# Patient Record
Sex: Female | Born: 1935 | Race: White | Hispanic: No | Smoking: Former smoker
Health system: Southern US, Community
[De-identification: ages and names within clinical notes are randomized; demographics above are authoritative.]

## PROBLEM LIST (undated history)

## (undated) DIAGNOSIS — I10 Essential (primary) hypertension: Secondary | ICD-10-CM

## (undated) DIAGNOSIS — F32A Depression, unspecified: Secondary | ICD-10-CM

## (undated) DIAGNOSIS — R1310 Dysphagia, unspecified: Secondary | ICD-10-CM

## (undated) DIAGNOSIS — F329 Major depressive disorder, single episode, unspecified: Secondary | ICD-10-CM

## (undated) DIAGNOSIS — R062 Wheezing: Secondary | ICD-10-CM

## (undated) DIAGNOSIS — J449 Chronic obstructive pulmonary disease, unspecified: Secondary | ICD-10-CM

## (undated) DIAGNOSIS — G4730 Sleep apnea, unspecified: Secondary | ICD-10-CM

## (undated) DIAGNOSIS — G629 Polyneuropathy, unspecified: Secondary | ICD-10-CM

## (undated) DIAGNOSIS — R059 Cough, unspecified: Secondary | ICD-10-CM

## (undated) DIAGNOSIS — R569 Unspecified convulsions: Secondary | ICD-10-CM

## (undated) DIAGNOSIS — K219 Gastro-esophageal reflux disease without esophagitis: Secondary | ICD-10-CM

## (undated) DIAGNOSIS — H9190 Unspecified hearing loss, unspecified ear: Secondary | ICD-10-CM

## (undated) DIAGNOSIS — J45909 Unspecified asthma, uncomplicated: Secondary | ICD-10-CM

## (undated) DIAGNOSIS — R0902 Hypoxemia: Secondary | ICD-10-CM

## (undated) DIAGNOSIS — R05 Cough: Secondary | ICD-10-CM

## (undated) DIAGNOSIS — G2581 Restless legs syndrome: Secondary | ICD-10-CM

## (undated) DIAGNOSIS — I509 Heart failure, unspecified: Secondary | ICD-10-CM

## (undated) DIAGNOSIS — R609 Edema, unspecified: Secondary | ICD-10-CM

## (undated) DIAGNOSIS — K759 Inflammatory liver disease, unspecified: Secondary | ICD-10-CM

## (undated) HISTORY — PX: EYE SURGERY: SHX253

## (undated) HISTORY — PX: HEMORRHOID SURGERY: SHX153

## (undated) HISTORY — PX: OTHER SURGICAL HISTORY: SHX169

## (undated) HISTORY — PX: HERNIA REPAIR: SHX51

## (undated) HISTORY — PX: ABDOMINAL HYSTERECTOMY: SHX81

## (undated) HISTORY — PX: CHOLECYSTECTOMY: SHX55

---

## 2014-06-24 ENCOUNTER — Other Ambulatory Visit: Payer: Self-pay

## 2014-06-24 ENCOUNTER — Encounter: Payer: Self-pay | Admitting: Emergency Medicine

## 2014-06-24 ENCOUNTER — Emergency Department
Admission: EM | Admit: 2014-06-24 | Discharge: 2014-06-24 | Disposition: A | Discharge status: Home / Self Care | Payer: Medicare Other | Attending: Emergency Medicine | Admitting: Emergency Medicine

## 2014-06-24 ENCOUNTER — Emergency Department: Payer: Medicare Other

## 2014-06-24 DIAGNOSIS — I10 Essential (primary) hypertension: Secondary | ICD-10-CM | POA: Insufficient documentation

## 2014-06-24 DIAGNOSIS — J209 Acute bronchitis, unspecified: Secondary | ICD-10-CM | POA: Diagnosis not present

## 2014-06-24 DIAGNOSIS — Z87891 Personal history of nicotine dependence: Secondary | ICD-10-CM | POA: Insufficient documentation

## 2014-06-24 DIAGNOSIS — R05 Cough: Secondary | ICD-10-CM | POA: Diagnosis present

## 2014-06-24 DIAGNOSIS — J45909 Unspecified asthma, uncomplicated: Secondary | ICD-10-CM | POA: Diagnosis not present

## 2014-06-24 HISTORY — DX: Unspecified asthma, uncomplicated: J45.909

## 2014-06-24 HISTORY — DX: Essential (primary) hypertension: I10

## 2014-06-24 LAB — BASIC METABOLIC PANEL
Anion gap: 7 (ref 5–15)
BUN: 12 mg/dL (ref 6–20)
CO2: 32 mmol/L (ref 22–32)
Calcium: 8.9 mg/dL (ref 8.9–10.3)
Chloride: 96 mmol/L — ABNORMAL LOW (ref 101–111)
Creatinine, Ser: 1.18 mg/dL — ABNORMAL HIGH (ref 0.44–1.00)
GFR calc Af Amer: 49 mL/min — ABNORMAL LOW (ref >60)
GFR calc non Af Amer: 43 mL/min — ABNORMAL LOW (ref >60)
Glucose, Bld: 114 mg/dL — ABNORMAL HIGH (ref 65–99)
POTASSIUM: 4.3 mmol/L (ref 3.5–5.1)
Sodium: 135 mmol/L (ref 135–145)

## 2014-06-24 LAB — TROPONIN I: Troponin I: <0.03 ng/mL (ref <0.031)

## 2014-06-24 LAB — CBC
HCT: 36.6 % (ref 35.0–47.0)
Hemoglobin: 12.3 g/dL (ref 12.0–16.0)
MCH: 30.2 pg (ref 26.0–34.0)
MCHC: 33.5 g/dL (ref 32.0–36.0)
MCV: 90.2 fL (ref 80.0–100.0)
PLATELETS: 338 10*3/uL (ref 150–440)
RBC: 4.06 MIL/uL (ref 3.80–5.20)
RDW: 13.5 % (ref 11.5–14.5)
WBC: 10 10*3/uL (ref 3.6–11.0)

## 2014-06-24 MED ORDER — AZITHROMYCIN 250 MG PO TABS
ORAL_TABLET | ORAL | Status: AC
  Filled 2014-06-24: qty 2

## 2014-06-24 MED ORDER — AZITHROMYCIN 250 MG PO TABS: 250.0000 mg | ORAL_TABLET | Freq: Once | ORAL | Status: DC

## 2014-06-24 MED ORDER — PREDNISONE 20 MG PO TABS
ORAL_TABLET | ORAL | Status: AC
  Filled 2014-06-24: qty 2

## 2014-06-24 MED ORDER — AZITHROMYCIN 250 MG PO TABS
500.0000 mg | ORAL_TABLET | Freq: Once | ORAL | Status: AC
  Administered 2014-06-24: 500 mg via ORAL

## 2014-06-24 MED ORDER — ALBUTEROL SULFATE HFA 108 (90 BASE) MCG/ACT IN AERS: INHALATION_SPRAY | RESPIRATORY_TRACT | Status: AC

## 2014-06-24 MED ORDER — BENZONATATE 100 MG PO CAPS
100.0000 mg | ORAL_CAPSULE | ORAL | Status: AC
  Administered 2014-06-24: 100 mg via ORAL
  Filled 2014-06-24: qty 1

## 2014-06-24 MED ORDER — PREDNISONE 20 MG PO TABS: ORAL_TABLET | ORAL | Status: DC

## 2014-06-24 MED ORDER — IPRATROPIUM-ALBUTEROL 0.5-2.5 (3) MG/3ML IN SOLN
3.0000 mL | Freq: Once | RESPIRATORY_TRACT | Status: AC
  Administered 2014-06-24: 3 mL via RESPIRATORY_TRACT
  Filled 2014-06-24: qty 3

## 2014-06-24 MED ORDER — BENZONATATE 100 MG PO CAPS
ORAL_CAPSULE | ORAL | Status: AC
  Filled 2014-06-24: qty 1

## 2014-06-24 MED ORDER — PREDNISONE 20 MG PO TABS
40.0000 mg | ORAL_TABLET | ORAL | Status: AC
Start: 2014-06-24 — End: 2014-06-24
  Administered 2014-06-24: 40 mg via ORAL

## 2014-06-24 MED ORDER — IPRATROPIUM-ALBUTEROL 0.5-2.5 (3) MG/3ML IN SOLN
RESPIRATORY_TRACT | Status: AC
  Filled 2014-06-24: qty 3

## 2014-06-24 NOTE — ED Notes (Signed)
D/c instructions reviewed w/ pt - pt denies any further questions or concerns at present.   

## 2014-06-24 NOTE — ED Notes (Signed)
Pt in with co cough productive sputum greenish sputum, no fever, no shob.  States has hx of pneumonia in the past.

## 2014-06-24 NOTE — ED Notes (Signed)
[facility] +2 weeks. No increased work of breathing noted at this time.

## 2014-06-24 NOTE — ED Provider Notes (Signed)
[facility] Emergency Department Provider Note  ____________________________________________  Time seen: Approximately 8:53 PM  I have reviewed the triage vital signs and the nursing notes.   HISTORY  Chief Complaint Cough    HPI Monica Warren is a 81 y.o. female with multiple chronic medical problems who presents with about a week of worsening productive cough.  She has a history of asthma and is a former smoker.  She has had pneumonia in the past.  She has multiple other chronic complaints such as intermittent "episodes "in which she has trouble with her vision, but the acute complaint that brings her in today is the cough.  He is in no acute distress now and is frustrated with the cough but is not having trouble breathing.  She has no chest pain and no shortness of breath that is not associated with cough.  She has no abdominal pain, nausea, vomiting, or dysuria.  The cough is severe, particularly at night.   Past Medical History  Diagnosis Date  . Asthma   . Hypertension     There are no active problems to display for this patient.   History reviewed. No pertinent past surgical history.  Current Outpatient Rx  Name  Route  Sig  Dispense  Refill  . albuterol (PROVENTIL HFA;VENTOLIN HFA) 108 (90 BASE) MCG/ACT inhaler      Inhale 4-6 puffs by mouth every 4 hours as needed for wheezing, cough, and/or shortness of breath   1 Inhaler   1   . azithromycin (ZITHROMAX) 250 MG tablet   Oral   Take 1 tablet (250 mg total) by mouth once.   4 each   0   . predniSONE (DELTASONE) 20 MG tablet      Take 2 pills (40 mg) PO on day 1 (tomorrow), 2 pills PO on day 2, 1 pill (20 mg) PO on day 3-5.   7 tablet   0     Allergies Review of patient's allergies indicates no known allergies.  History reviewed. No pertinent family history.  Social History History  Substance Use Topics  . Smoking status: Former Games developer  . Smokeless tobacco: Not on file   . Alcohol Use: No    Review of Systems Constitutional: No fever/chills Eyes: Sometimes has episodes where her vision seems to "go away".  (Chronic and infrequent) ENT: No sore throat. Cardiovascular: Denies chest pain. Respiratory: Denies shortness of breath.  Some wheezing.  Persistent productive cough. Gastrointestinal: No abdominal pain.  No nausea, no vomiting.  No diarrhea.  No constipation. Genitourinary: Negative for dysuria. Musculoskeletal: Negative for back pain. Skin: Negative for rash. Neurological: Negative for headaches, focal weakness or numbness.  10-point ROS otherwise negative.  ____________________________________________   PHYSICAL EXAM:  VITAL SIGNS: ED Triage Vitals  Enc Vitals Group     BP 06/24/14 2005 141/85 mmHg     Pulse Rate 06/24/14 2005 86     Resp 06/24/14 2005 18     Temp 06/24/14 2005 98.9 F (37.2 C)     Temp Source 06/24/14 2005 Oral     SpO2 06/24/14 2005 95 %     Weight 06/24/14 2005 230 lb (104.327 kg)     Height 06/24/14 2005  (1.702 m)     Head Cir --      Peak Flow --      Pain Score 06/24/14 2006 9     Pain Loc --      Pain Edu? --  Excl. in GC? --     Constitutional: Alert and oriented. Well appearing and in no acute distress. Eyes: Conjunctivae are normal. PERRL. EOMI. Head: Atraumatic. Nose: No congestion/rhinnorhea. Mouth/Throat: Mucous membranes are moist.  Oropharynx non-erythematous. Neck: No stridor.   Cardiovascular: Normal rate, regular rhythm. Grossly normal heart sounds.  Good peripheral circulation. Respiratory: Normal respiratory effort.  No retractions.  Mild expiratory wheezes throughout lung fields.  Frequent productive cough Gastrointestinal: Soft and nontender. No distention. No abdominal bruits. No CVA tenderness. Musculoskeletal: No lower extremity tenderness nor edema.  No joint effusions. Neurologic:  Normal speech and language. No gross focal neurologic deficits are appreciated. Speech is  normal. No gait instability. Skin:  Skin is warm, dry and intact. No rash noted. Psychiatric: Mood and affect are normal. Speech and behavior are normal.  ____________________________________________   LABS (all labs ordered are listed, but only abnormal results are displayed)  Labs Reviewed  BASIC METABOLIC PANEL - Abnormal; Notable for the following:    Chloride 96 (*)    Glucose, Bld 114 (*)    Creatinine, Ser 1.18 (*)    GFR calc non Af Amer 43 (*)    GFR calc Af Amer 49 (*)    All other components within normal limits  CBC  TROPONIN I   ____________________________________________  EKG  ED ECG REPORT I, Saron Tweed, the attending physician, personally viewed and interpreted this ECG.   Date: 06/24/2014  EKG Time: 20:16  Rate: 85  Rhythm: normal sinus rhythm, PAC's noted  Axis: Normal  Intervals:none  ST&T Change: Non-specific ST segment / T-wave changes, but no evidence of acute ischemia.   ____________________________________________  RADIOLOGY  Dg Chest 2 View  06/24/2014   CLINICAL DATA:  Productive cough.  Pneumonia.  EXAM: CHEST  2 VIEW  COMPARISON:  None.  FINDINGS: The patient is rotated to the bright on today's radiograph, reducing diagnostic sensitivity and specificity. Large lung volumes raise the possibility of emphysema. Heart size within normal limits. Atherosclerotic aortic arch.  No pleural effusion. Subsegmental atelectasis along the lingula and potentially at the right lung base, but no consolidation to suggest pneumonia.  IMPRESSION: 1. No pneumonia is identified. 2. Subsegmental atelectasis in the lingula and at the right lung base. 3. Atherosclerotic aortic arch. 4. Tapering of the peripheral pulmonary vasculature favors emphysema.   Electronically Signed   By: Gaylyn Rong M.D.   On: 06/24/2014 20:44    ____________________________________________   INITIAL IMPRESSION / ASSESSMENT AND PLAN / ED COURSE  Pertinent labs & imaging results  that were available during my care of the patient were reviewed by me and considered in my medical decision making (see chart for details).  The patient's signs and symptoms are most consistent with acute bronchitis.  Given the probability of emphysema/COPD and the persistence of her cough 2 weeks I will treat with azithromycin as well as prednisone and albuterol.  She does have an albuterol inhaler at home and I encouraged the more frequent use of it.  She felt better after the DuoNeb in the emergency department and stated she was hungry and ready to go home.  I gave her and her family my usual and customary return precautions.  ____________________________________________  FINAL CLINICAL IMPRESSION(S) / ED DIAGNOSES  Final diagnoses:  Acute bronchitis, unspecified organism      NEW MEDICATIONS STARTED DURING THIS VISIT:  Discharge Medication List as of 06/24/2014 10:06 PM    START taking these medications   Details  albuterol (PROVENTIL HFA;VENTOLIN  HFA) 108 (90 BASE) MCG/ACT inhaler Inhale 4-6 puffs by mouth every 4 hours as needed for wheezing, cough, and/or shortness of breath, Print    azithromycin (ZITHROMAX) 250 MG tablet Take 1 tablet (250 mg total) by mouth once., Starting 06/24/2014, Print    predniSONE (DELTASONE) 20 MG tablet Take 2 pills (40 mg) PO on day 1 (tomorrow), 2 pills PO on day 2, 1 pill (20 mg) PO on day 3-5., Print         Loleta Roseory Layne Dilauro, MD 06/24/14 2310

## 2014-06-24 NOTE — Discharge Instructions (Signed)
We believe that your symptoms are caused today by bronchitis.  Please take the prescribed medications and any medications that you have at home.  Follow up with your doctor as recommended.  If you develop any new or worsening symptoms, including but not limited to fever, persistent vomiting, worsening shortness of breath, or other symptoms that concern you, please return to the Emergency Department immediately.   Acute Bronchitis Bronchitis is inflammation of the airways that extend from the windpipe into the lungs (bronchi). The inflammation often causes mucus to develop. This leads to a cough, which is the most common symptom of bronchitis.  In acute bronchitis, the condition usually develops suddenly and goes away over time, usually in a couple weeks. Smoking, allergies, and asthma can make bronchitis worse. Repeated episodes of bronchitis may cause further lung problems.  CAUSES Acute bronchitis is most often caused by the same virus that causes a cold. The virus can spread from person to person (contagious) through coughing, sneezing, and touching contaminated objects. SIGNS AND SYMPTOMS   Cough.   Fever.   Coughing up mucus.   Body aches.   Chest congestion.   Chills.   Shortness of breath.   Sore throat.  DIAGNOSIS  Acute bronchitis is usually diagnosed through a physical exam. Your health care provider will also ask you questions about your medical history. Tests, such as chest X-rays, are sometimes done to rule out other conditions.  TREATMENT  Acute bronchitis usually goes away in a couple weeks. Oftentimes, no medical treatment is necessary. Medicines are sometimes given for relief of fever or cough. Antibiotic medicines are usually not needed but may be prescribed in certain situations. In some cases, an inhaler may be recommended to help reduce shortness of breath and control the cough. A cool mist vaporizer may also be used to help thin bronchial secretions and make  it easier to clear the chest.  HOME CARE INSTRUCTIONS  Get plenty of rest.   Drink enough fluids to keep your urine clear or pale yellow (unless you have a medical condition that requires fluid restriction). Increasing fluids may help thin your respiratory secretions (sputum) and reduce chest congestion, and it will prevent dehydration.   Take medicines only as directed by your health care provider.  If you were prescribed an antibiotic medicine, finish it all even if you start to feel better.  Avoid smoking and secondhand smoke. Exposure to cigarette smoke or irritating chemicals will make bronchitis worse. If you are a smoker, consider using nicotine gum or skin patches to help control withdrawal symptoms. Quitting smoking will help your lungs heal faster.   Reduce the chances of another bout of acute bronchitis by washing your hands frequently, avoiding people with cold symptoms, and trying not to touch your hands to your mouth, nose, or eyes.   Keep all follow-up visits as directed by your health care provider.  SEEK MEDICAL CARE IF: Your symptoms do not improve after 1 week of treatment.  SEEK IMMEDIATE MEDICAL CARE IF:  You develop an increased fever or chills.   You have chest pain.   You have severe shortness of breath.  You have bloody sputum.   You develop dehydration.  You faint or repeatedly feel like you are going to pass out.  You develop repeated vomiting.  You develop a severe headache. MAKE SURE YOU:   Understand these instructions.  Will watch your condition.  Will get help right away if you are not doing well or get worse.  Document Released: 02/16/2004 Document Revised: 05/25/2013 Document Reviewed: 07/01/2012 [facility] Patient Information 2015 [place], [place]. This information is not intended to replace advice given to you by your health care provider. Make sure you discuss any questions you have with your health care provider.

## 2014-06-25 ENCOUNTER — Telehealth: Payer: Self-pay | Admitting: Emergency Medicine

## 2015-05-12 ENCOUNTER — Encounter: Payer: Self-pay | Admitting: Emergency Medicine

## 2015-05-12 ENCOUNTER — Observation Stay
Admission: EM | Admit: 2015-05-12 | Discharge: 2015-05-14 | Payer: Medicare Other | Attending: Internal Medicine | Admitting: Internal Medicine

## 2015-05-12 DIAGNOSIS — Z87891 Personal history of nicotine dependence: Secondary | ICD-10-CM | POA: Diagnosis not present

## 2015-05-12 DIAGNOSIS — I10 Essential (primary) hypertension: Secondary | ICD-10-CM | POA: Insufficient documentation

## 2015-05-12 DIAGNOSIS — K922 Gastrointestinal hemorrhage, unspecified: Principal | ICD-10-CM | POA: Insufficient documentation

## 2015-05-12 DIAGNOSIS — R531 Weakness: Secondary | ICD-10-CM | POA: Diagnosis not present

## 2015-05-12 DIAGNOSIS — R197 Diarrhea, unspecified: Secondary | ICD-10-CM | POA: Diagnosis not present

## 2015-05-12 DIAGNOSIS — K449 Diaphragmatic hernia without obstruction or gangrene: Secondary | ICD-10-CM | POA: Diagnosis not present

## 2015-05-12 DIAGNOSIS — Z7982 Long term (current) use of aspirin: Secondary | ICD-10-CM | POA: Diagnosis not present

## 2015-05-12 DIAGNOSIS — K921 Melena: Secondary | ICD-10-CM | POA: Diagnosis present

## 2015-05-12 DIAGNOSIS — E039 Hypothyroidism, unspecified: Secondary | ICD-10-CM | POA: Insufficient documentation

## 2015-05-12 DIAGNOSIS — Z6835 Body mass index (BMI) 35.0-35.9, adult: Secondary | ICD-10-CM | POA: Diagnosis not present

## 2015-05-12 DIAGNOSIS — J45909 Unspecified asthma, uncomplicated: Secondary | ICD-10-CM | POA: Diagnosis not present

## 2015-05-12 DIAGNOSIS — K5792 Diverticulitis of intestine, part unspecified, without perforation or abscess without bleeding: Secondary | ICD-10-CM | POA: Insufficient documentation

## 2015-05-12 LAB — CBC WITH DIFFERENTIAL/PLATELET
BASOS PCT: 1 %
Basophils Absolute: 0.1 10*3/uL (ref 0–0.1)
Eosinophils Absolute: 0.1 10*3/uL (ref 0–0.7)
Eosinophils Relative: 1 %
HCT: 39.8 % (ref 35.0–47.0)
HEMOGLOBIN: 13.2 g/dL (ref 12.0–16.0)
LYMPHS ABS: 2.4 10*3/uL (ref 1.0–3.6)
Lymphocytes Relative: 21 %
MCH: 29 pg (ref 26.0–34.0)
MCHC: 33.2 g/dL (ref 32.0–36.0)
MCV: 87.4 fL (ref 80.0–100.0)
MONO ABS: 1 10*3/uL — AB (ref 0.2–0.9)
MONOS PCT: 9 %
NEUTROS PCT: 68 %
Neutro Abs: 7.8 10*3/uL — ABNORMAL HIGH (ref 1.4–6.5)
Platelets: 272 10*3/uL (ref 150–440)
RBC: 4.55 MIL/uL (ref 3.80–5.20)
RDW: 14.1 % (ref 11.5–14.5)
WBC: 11.4 10*3/uL — ABNORMAL HIGH (ref 3.6–11.0)

## 2015-05-12 LAB — URINALYSIS COMPLETE WITH MICROSCOPIC (ARMC ONLY)
BACTERIA UA: NONE SEEN
Bilirubin Urine: NEGATIVE
Glucose, UA: NEGATIVE mg/dL
Hgb urine dipstick: NEGATIVE
Ketones, ur: NEGATIVE mg/dL
LEUKOCYTES UA: NEGATIVE
NITRITE: NEGATIVE
PH: 7 (ref 5.0–8.0)
Protein, ur: NEGATIVE mg/dL
RBC / HPF: NONE SEEN RBC/hpf (ref 0–5)
Specific Gravity, Urine: 1.008 (ref 1.005–1.030)
WBC, UA: NONE SEEN WBC/hpf (ref 0–5)

## 2015-05-12 LAB — COMPREHENSIVE METABOLIC PANEL
ALBUMIN: 2.7 g/dL — AB (ref 3.5–5.0)
ALK PHOS: 70 U/L (ref 38–126)
ALT: 12 U/L — ABNORMAL LOW (ref 14–54)
AST: 20 U/L (ref 15–41)
Anion gap: 5 (ref 5–15)
BILIRUBIN TOTAL: 0.6 mg/dL (ref 0.3–1.2)
BUN: 9 mg/dL (ref 6–20)
CO2: 28 mmol/L (ref 22–32)
Calcium: 8.7 mg/dL — ABNORMAL LOW (ref 8.9–10.3)
Chloride: 104 mmol/L (ref 101–111)
Creatinine, Ser: 0.94 mg/dL (ref 0.44–1.00)
GFR calc Af Amer: >60 mL/min (ref >60)
GFR calc non Af Amer: 56 mL/min — ABNORMAL LOW (ref >60)
GLUCOSE: 98 mg/dL (ref 65–99)
Potassium: 4.1 mmol/L (ref 3.5–5.1)
SODIUM: 137 mmol/L (ref 135–145)
TOTAL PROTEIN: 6.1 g/dL — AB (ref 6.5–8.1)

## 2015-05-12 LAB — C DIFFICILE QUICK SCREEN W PCR REFLEX
C DIFFICILE (CDIFF) TOXIN: NEGATIVE
C DIFFICLE (CDIFF) ANTIGEN: NEGATIVE
C Diff interpretation: NEGATIVE

## 2015-05-12 LAB — HEMOGLOBIN AND HEMATOCRIT, BLOOD
HEMATOCRIT: 39.4 % (ref 35.0–47.0)
HEMOGLOBIN: 12.8 g/dL (ref 12.0–16.0)

## 2015-05-12 LAB — APTT: aPTT: 31 seconds (ref 24–36)

## 2015-05-12 LAB — PROTIME-INR
INR: 1.04
Prothrombin Time: 13.8 seconds (ref 11.4–15.0)

## 2015-05-12 LAB — MRSA PCR SCREENING: MRSA BY PCR: NEGATIVE

## 2015-05-12 LAB — LIPASE, BLOOD: Lipase: 17 U/L (ref 11–51)

## 2015-05-12 MED ORDER — LEVOTHYROXINE SODIUM 125 MCG PO TABS
125.0000 ug | ORAL_TABLET | Freq: Every day | ORAL | Status: DC
  Administered 2015-05-14: 11:00:00 125 ug via ORAL
  Filled 2015-05-12: qty 1

## 2015-05-12 MED ORDER — TORSEMIDE 20 MG PO TABS
20.0000 mg | ORAL_TABLET | Freq: Two times a day (BID) | ORAL | Status: DC
  Administered 2015-05-12 – 2015-05-14 (×3): 20 mg via ORAL
  Filled 2015-05-12 (×3): qty 1

## 2015-05-12 MED ORDER — ALBUTEROL SULFATE HFA 108 (90 BASE) MCG/ACT IN AERS: 2.0000 | INHALATION_SPRAY | RESPIRATORY_TRACT | Status: DC | PRN

## 2015-05-12 MED ORDER — ASPIRIN EC 81 MG PO TBEC
81.0000 mg | DELAYED_RELEASE_TABLET | Freq: Every day | ORAL | Status: DC
  Administered 2015-05-12: 81 mg via ORAL
  Filled 2015-05-12: qty 1

## 2015-05-12 MED ORDER — ALBUTEROL SULFATE (2.5 MG/3ML) 0.083% IN NEBU
2.5000 mg | INHALATION_SOLUTION | RESPIRATORY_TRACT | Status: DC | PRN
  Administered 2015-05-12: 21:00:00 2.5 mg via RESPIRATORY_TRACT
  Filled 2015-05-12: qty 3

## 2015-05-12 MED ORDER — SODIUM CHLORIDE 0.9% FLUSH
3.0000 mL | Freq: Two times a day (BID) | INTRAVENOUS | Status: DC
  Administered 2015-05-12 – 2015-05-13 (×3): 3 mL via INTRAVENOUS

## 2015-05-12 MED ORDER — PANTOPRAZOLE SODIUM 40 MG IV SOLR
40.0000 mg | Freq: Two times a day (BID) | INTRAVENOUS | Status: DC
  Administered 2015-05-13 (×2): 40 mg via INTRAVENOUS
  Filled 2015-05-12 (×2): qty 40

## 2015-05-12 MED ORDER — SODIUM CHLORIDE 0.9 % IV SOLN
250.0000 mL | INTRAVENOUS | Status: DC | PRN
  Administered 2015-05-13: 15:00:00 via INTRAVENOUS

## 2015-05-12 MED ORDER — ENALAPRIL MALEATE 5 MG PO TABS
20.0000 mg | ORAL_TABLET | Freq: Every day | ORAL | Status: DC
  Administered 2015-05-12 – 2015-05-14 (×2): 20 mg via ORAL
  Filled 2015-05-12 (×2): qty 4

## 2015-05-12 MED ORDER — AMLODIPINE BESYLATE 5 MG PO TABS
5.0000 mg | ORAL_TABLET | Freq: Every day | ORAL | Status: DC
  Administered 2015-05-12 – 2015-05-14 (×2): 5 mg via ORAL
  Filled 2015-05-12 (×2): qty 1

## 2015-05-12 MED ORDER — OXYCODONE HCL 5 MG PO TABS: 5.0000 mg | ORAL_TABLET | ORAL | Status: DC | PRN

## 2015-05-12 MED ORDER — PANTOPRAZOLE SODIUM 40 MG IV SOLR
40.0000 mg | Freq: Once | INTRAVENOUS | Status: AC
  Administered 2015-05-12: 40 mg via INTRAVENOUS
  Filled 2015-05-12: qty 40

## 2015-05-12 MED ORDER — ACETAMINOPHEN 325 MG PO TABS: 650.0000 mg | ORAL_TABLET | Freq: Four times a day (QID) | ORAL | Status: DC | PRN

## 2015-05-12 MED ORDER — METOPROLOL TARTRATE 1 MG/ML IV SOLN: 5.0000 mg | INTRAVENOUS | Status: DC | PRN

## 2015-05-12 MED ORDER — GABAPENTIN 300 MG PO CAPS
900.0000 mg | ORAL_CAPSULE | Freq: Every evening | ORAL | Status: DC
  Administered 2015-05-12 – 2015-05-13 (×2): 900 mg via ORAL
  Filled 2015-05-12 (×2): qty 3

## 2015-05-12 MED ORDER — PAROXETINE HCL 20 MG PO TABS
40.0000 mg | ORAL_TABLET | Freq: Every day | ORAL | Status: DC
  Administered 2015-05-12 – 2015-05-14 (×2): 40 mg via ORAL
  Filled 2015-05-12 (×2): qty 2

## 2015-05-12 MED ORDER — SODIUM CHLORIDE 0.9% FLUSH: 3.0000 mL | INTRAVENOUS | Status: DC | PRN

## 2015-05-12 NOTE — ED Notes (Signed)
Patient presents to the ED with two episodes of bloody stools.  Patient reports diarrhea for about 2 weeks.  Patient states she was hospitalized for dehydration.  Patient reports a history of diverticulitis as well.

## 2015-05-12 NOTE — H&P (Signed)
[place] [facility] [place] NAME: Monica Warren    MR#:  [id number]  DATE OF BIRTH:  12-Jun-1932  DATE OF ADMISSION:  05/12/2015  PRIMARY CARE PHYSICIAN: Jeraldine Loots, MD   REQUESTING/REFERRING PHYSICIAN: Carrie Mew, MD  CHIEF COMPLAINT:   Rectal bleeding HISTORY OF PRESENT ILLNESS:  Monica Warren  is a 82 y.o. female with a known history of essential hypertension asthma, hypothyroidism and obesity is brought into the ED due to multiple episodes of bloody stool at [facility] Patient was recently diagnosed with diverticulitis and has been having diarrhea for approximately 2 weeks after completing the antibiotic course given at [facility] on 05/01/2015. Patient denies any abdominal pain, nausea or vomiting. As reported by the facility she has been having episodes of black stool mixed with intermittent episodes of bloody stools, but patient denies any. Daughter at bedside. Patient reports that she had colonoscopy done several years ago and diagnosed with diverticulosis  PAST MEDICAL HISTORY:   Past Medical History  Diagnosis Date  . Asthma   . Hypertension    Hypothyroidism PAST SURGICAL HISTOIRY:  History reviewed. No pertinent past surgical history.  SOCIAL HISTORY:   Social History  Substance Use Topics  . Smoking status: Former Research scientist (life sciences)  . Smokeless tobacco: Not on file  . Alcohol Use: No    FAMILY HISTORY:  Hypertension runs in her family  DRUG ALLERGIES:  No Known Allergies  REVIEW OF SYSTEMS:  CONSTITUTIONAL: No fever, fatigue or weakness.  EYES: No blurred or double vision.  EARS, NOSE, AND THROAT: No tinnitus or ear pain.  RESPIRATORY: No cough, shortness of breath, wheezing or hemoptysis.  CARDIOVASCULAR: No chest pain, orthopnea, edema.  GASTROINTESTINAL: No nausea, vomiting, diarrhea or abdominal pain. According to the facility patient has been having bloody bowel movements alternating with black tarry  stool GENITOURINARY: No dysuria, hematuria.  ENDOCRINE: No polyuria, nocturia,  HEMATOLOGY: No anemia, easy bruising or bleeding SKIN: No rash or lesion. MUSCULOSKELETAL: No joint pain or arthritis.   NEUROLOGIC: No tingling, numbness, weakness.  PSYCHIATRY: No anxiety or depression.   MEDICATIONS AT HOME:   Prior to Admission medications   Medication Sig Start Date End Date Taking? Authorizing Provider  acetaminophen (TYLENOL) 325 MG tablet Take 650 mg by mouth every 6 (six) hours as needed for mild pain or headache.   Yes Historical Provider, MD  albuterol (PROVENTIL HFA;VENTOLIN HFA) 108 (90 BASE) MCG/ACT inhaler Inhale 4-6 puffs by mouth every 4 hours as needed for wheezing, cough, and/or shortness of breath 06/24/14  Yes Hinda Kehr, MD  albuterol (PROVENTIL) (2.5 MG/3ML) 0.083% nebulizer solution Take 2.5 mg by nebulization every 4 (four) hours as needed for wheezing or shortness of breath.   Yes Historical Provider, MD  amLODipine (NORVASC) 5 MG tablet Take 5 mg by mouth daily.   Yes Historical Provider, MD  aspirin EC 81 MG tablet Take 81 mg by mouth daily.   Yes Historical Provider, MD  Cyanocobalamin 1000 MCG/ML KIT Inject 1,000 mcg/mL as directed every 30 (thirty) days.   Yes Historical Provider, MD  enalapril (VASOTEC) 20 MG tablet Take 20 mg by mouth daily.   Yes Historical Provider, MD  furosemide (LASIX) 20 MG tablet Take 20 mg by mouth.   Yes Historical Provider, MD  gabapentin (NEURONTIN) 300 MG capsule Take 900 mg by mouth every evening.   Yes Historical Provider, MD  levothyroxine (SYNTHROID, LEVOTHROID) 125 MCG tablet Take 125 mcg by mouth daily before breakfast.   Yes  Historical Provider, MD  PARoxetine (PAXIL) 40 MG tablet Take 40 mg by mouth daily.   Yes Historical Provider, MD  torsemide (DEMADEX) 20 MG tablet Take 20 mg by mouth 2 (two) times daily.   Yes Historical Provider, MD      VITAL SIGNS:  Blood pressure 180/60, pulse 80, temperature 98.6 F (37 C),  temperature source Oral, resp. rate 26, height 5' 7"  (1.702 m), weight 108.863 kg (240 lb), SpO2 98 %.  PHYSICAL EXAMINATION:  GENERAL:  82 y.o.-year-old patient lying in the bed with no acute distress. Morbidly obese EYES: Pupils equal, round, reactive to light and accommodation. No scleral icterus. Extraocular muscles intact.  HEENT: Head atraumatic, normocephalic. Oropharynx and nasopharynx clear.  NECK:  Supple, no jugular venous distention. No thyroid enlargement, no tenderness.  LUNGS: Normal breath sounds bilaterally, no wheezing, rales,rhonchi or crepitation. No use of accessory muscles of respiration.  CARDIOVASCULAR: S1, S2 normal. No murmurs, rubs, or gallops.  ABDOMEN: Soft, nontender, nondistended. Bowel sounds present. No organomegaly or mass.  EXTREMITIES: No pedal edema, cyanosis, or clubbing.  NEUROLOGIC: Cranial nerves II through XII are intact. Muscle strength 5/5 in all extremities. Sensation intact. Gait not checked.  PSYCHIATRIC: The patient is alert and oriented x 3.  SKIN: No obvious rash, lesion, or ulcer.   LABORATORY PANEL:   CBC  Recent Labs Lab 05/12/15 1232  WBC 11.4*  HGB 13.2  HCT 39.8  PLT 272   ------------------------------------------------------------------------------------------------------------------  Chemistries   Recent Labs Lab 05/12/15 1232  NA 137  K 4.1  CL 104  CO2 28  GLUCOSE 98  BUN 9  CREATININE 0.94  CALCIUM 8.7*  AST 20  ALT 12*  ALKPHOS 70  BILITOT 0.6   ------------------------------------------------------------------------------------------------------------------  Cardiac Enzymes No results for input(s): TROPONINI in the last 168 hours. ------------------------------------------------------------------------------------------------------------------  RADIOLOGY:  No results found.  EKG:   Orders placed or performed during the hospital encounter of 06/24/14  . EKG test  . EKG test  . EKG     IMPRESSION AND PLAN:   Monica Warren  is a 82 y.o. female with a known history of essential hypertension asthma, hypothyroidism and obesity is brought into the ED due to multiple episodes of bloody stool at [facility] Patient was recently diagnosed with diverticulitis and has been having diarrhea for approximately 2 weeks after completing the antibiotic course given at [facility] on 05/01/2015. Patient denies any abdominal pain, nausea or vomiting.  # GI bleed-reporting melena and bloody stools- could be diverticular bleed but would not explain melena Rule out C. difficile colitis, enteric precautions in the meanwhile Hemoglobin is stable at this time and patient is also hemodynamically stable Monitor hemoglobin and hematocrit closely. Liquid diet, PPI GI consult is placed. Currently patient is not symptomatic so not considering any CT abdomen at this point  #Chronic history of hypothyroidism continue Synthroid  #Essential hypertension blood pressure is elevated. Resume her home medications and give Lopressor IV when necessary  #Morbid obesity-patient will be benefited with lifestyle changes once patient is medically stable   GI prophylaxis with PPI and DVT prophylaxis with SCDs at this time More than 50% time was spent on face-to-face education, counseling and coordination of care  All the records are reviewed and case discussed with ED provider. Management plans discussed with the patient, family and they are in agreement.  CODE STATUS: fc ,daughter HCPOA  TOTAL TIME TAKING CARE OF THIS PATIENT: 43  minutes.    Nicholes Mango M.D on 05/12/2015 at 5:36  PM  Between 7am to 6pm - Pager - [phone]  After 6pm go to [email or web address] - password [email or web address] [facility]  [facility]  Office  [phone]  CC: Primary care physician; Jeraldine Loots, MD

## 2015-05-12 NOTE — ED Provider Notes (Signed)
[facility] Emergency Department Provider Note  ____________________________________________  Time seen: 11:15 AM  I have reviewed the triage vital signs and the nursing notes.   HISTORY  Chief Complaint Rectal Bleeding    HPI Monica Warren is a 82 y.o. female sent to the ED due to multiple episodes of bloody stool. She's had diarrhea for about 12 days after taking antibiotics for diverticulitis that she was diagnosed with a [facility] on 05/01/2015. Also during that visit on April 9 she had a GI infectious panel that was negative for any pathogens including C. difficile at that time. Denies chest pain shortness of breath or syncope but has been feeling generally weak. Family is also noticed that she seems to lean on the wall whenever she stands up. She is at a rehabilitation facility right now due to difficulty walking and has not made progress as expected. Denies any hematemesis. A few weeks ago had an episode of black stool, and has continued to have intermittently bloody stools over the past 2 weeks.     Past Medical History  Diagnosis Date  . Asthma   . Hypertension      There are no active problems to display for this patient.    History reviewed. No pertinent past surgical history.   Current Outpatient Rx  Name  Route  Sig  Dispense  Refill  . albuterol (PROVENTIL HFA;VENTOLIN HFA) 108 (90 BASE) MCG/ACT inhaler      Inhale 4-6 puffs by mouth every 4 hours as needed for wheezing, cough, and/or shortness of breath   1 Inhaler   1   . azithromycin (ZITHROMAX) 250 MG tablet   Oral   Take 1 tablet (250 mg total) by mouth once.   4 each   0   . predniSONE (DELTASONE) 20 MG tablet      Take 2 pills (40 mg) PO on day 1 (tomorrow), 2 pills PO on day 2, 1 pill (20 mg) PO on day 3-5.   7 tablet   0      Allergies Review of patient's allergies indicates no known allergies.   No family history on file.  Social History Social History   Substance Use Topics  . Smoking status: Former Games developer  . Smokeless tobacco: None  . Alcohol Use: No    Review of Systems  Constitutional:   No fever or chills.  Eyes:   No vision changes.  ENT:   No sore throat. No rhinorrhea. Cardiovascular:   No chest pain. Respiratory:   No dyspnea or cough. Gastrointestinal:   Negative for abdominal pain, vomiting, Positive bloody diarrhea. Genitourinary:   Negative for dysuria or difficulty urinating. Musculoskeletal:   Negative for focal pain or swelling Neurological:   Negative for headaches 10-point ROS otherwise negative.  ____________________________________________   PHYSICAL EXAM:  VITAL SIGNS: ED Triage Vitals  Enc Vitals Group     BP 05/12/15 1106 145/70 mmHg     Pulse Rate 05/12/15 1106 79     Resp 05/12/15 1106 18     Temp 05/12/15 1106 98.6 F (37 C)     Temp Source 05/12/15 1106 Oral     SpO2 05/12/15 1106 96 %     Weight 05/12/15 1106 240 lb (108.863 kg)     Height 05/12/15 1106  (1.702 m)     Head Cir --      Peak Flow --      Pain Score 05/12/15 1109 0  Pain Loc --      Pain Edu? --      Excl. in GC? --     Vital signs reviewed, nursing assessments reviewed.   Constitutional:   Alert and oriented. Well appearing and in no distress. Eyes:   No scleral icterus. No conjunctival pallor. PERRL. EOMI ENT   Head:   Normocephalic and atraumatic.   Nose:   No congestion/rhinnorhea. No septal hematoma   Mouth/Throat:   MMM, no pharyngeal erythema. No peritonsillar mass.    Neck:   No stridor. No SubQ emphysema. No meningismus. Hematological/Lymphatic/Immunilogical:   No cervical lymphadenopathy. Cardiovascular:   RRR. Symmetric bilateral radial and DP pulses.  No murmurs.  Respiratory:   Normal respiratory effort without tachypnea nor retractions. Breath sounds are clear and equal bilaterally. No wheezes/rales/rhonchi. Gastrointestinal:   Soft and nontender. Non distended. There is no CVA  tenderness.  No rebound, rigidity, or guarding.Brown stool, strongly Hemoccult-positive. Genitourinary:   deferred Musculoskeletal:   Nontender with normal range of motion in all extremities. No joint effusions.  No lower extremity tenderness.  No edema. Neurologic:   Normal speech and language.  CN 2-10 normal. Motor grossly intact. No gross focal neurologic deficits are appreciated.  Skin:    Skin is warm, dry and intact. No rash noted.  No petechiae, purpura, or bullae.  ____________________________________________    LABS (pertinent positives/negatives) (all labs ordered are listed, but only abnormal results are displayed) Labs Reviewed  COMPREHENSIVE METABOLIC PANEL - Abnormal; Notable for the following:    Calcium 8.7 (*)    Total Protein 6.1 (*)    Albumin 2.7 (*)    ALT 12 (*)    GFR calc non Af Amer 56 (*)    All other components within normal limits  CBC WITH DIFFERENTIAL/PLATELET - Abnormal; Notable for the following:    WBC 11.4 (*)    Neutro Abs 7.8 (*)    Monocytes Absolute 1.0 (*)    All other components within normal limits  URINALYSIS COMPLETEWITH MICROSCOPIC ([facility] ONLY) - Abnormal; Notable for the following:    Color, Urine YELLOW (*)    APPearance CLEAR (*)    Squamous Epithelial / LPF 0-5 (*)    All other components within normal limits  C DIFFICILE QUICK SCREEN W PCR REFLEX  LIPASE, BLOOD  PROTIME-INR  APTT   ____________________________________________   EKG  Interpreted by me Normal sinus rhythm rate of 76, normal axis intervals QRS ST segments and T waves. There is one atrial premature contraction on the strip.  ____________________________________________    RADIOLOGY    ____________________________________________   PROCEDURES   ____________________________________________   INITIAL IMPRESSION / ASSESSMENT AND PLAN / ED COURSE  Pertinent labs & imaging results that were available during my care of the patient were reviewed  by me and considered in my medical decision making (see chart for details).  Patient presents with GI bleed. Vital signs and labs are unremarkable. Possible C. difficile, but white blood cell count not elevated, no abdominal distention or tenderness, afebrile, no recurrent episodes in the ED, but with her age comorbidities and the duration of symptoms, I discussed the case with the hospitalist for admission and further GI workup. Patient has not had a recent colonoscopy although her doctor has been advising her that she needs to see a gastroenterologist. According to family, the patient is very difficult to get her to follow-up and keep appointments.     ____________________________________________   FINAL CLINICAL IMPRESSION(S) / ED DIAGNOSES  Final diagnoses:  Acute GI bleeding       Portions of this note were generated with dragon dictation software. Dictation errors may occur despite best attempts at proofreading.   Sharman CheekPhillip Nicholas Trompeter, MD 05/12/15 1623

## 2015-05-13 ENCOUNTER — Encounter: Payer: Self-pay | Admitting: *Deleted

## 2015-05-13 ENCOUNTER — Observation Stay: Payer: Medicare Other | Admitting: Anesthesiology

## 2015-05-13 ENCOUNTER — Encounter
Admission: EM | Disposition: A | Discharge status: Other Acute Inpatient Hospital | Payer: Self-pay | Source: Home / Self Care | Attending: Emergency Medicine

## 2015-05-13 DIAGNOSIS — K922 Gastrointestinal hemorrhage, unspecified: Secondary | ICD-10-CM | POA: Insufficient documentation

## 2015-05-13 DIAGNOSIS — K921 Melena: Secondary | ICD-10-CM

## 2015-05-13 HISTORY — PX: ESOPHAGOGASTRODUODENOSCOPY (EGD) WITH PROPOFOL: SHX5813

## 2015-05-13 LAB — COMPREHENSIVE METABOLIC PANEL
ALBUMIN: 2.4 g/dL — AB (ref 3.5–5.0)
ALK PHOS: 56 U/L (ref 38–126)
ALT: 11 U/L — AB (ref 14–54)
ANION GAP: 6 (ref 5–15)
AST: 18 U/L (ref 15–41)
BILIRUBIN TOTAL: 0.8 mg/dL (ref 0.3–1.2)
BUN: 7 mg/dL (ref 6–20)
CALCIUM: 8.4 mg/dL — AB (ref 8.9–10.3)
CO2: 28 mmol/L (ref 22–32)
Chloride: 103 mmol/L (ref 101–111)
Creatinine, Ser: 0.97 mg/dL (ref 0.44–1.00)
GFR calc Af Amer: >60 mL/min (ref >60)
GFR calc non Af Amer: 54 mL/min — ABNORMAL LOW (ref >60)
GLUCOSE: 113 mg/dL — AB (ref 65–99)
Potassium: 3.8 mmol/L (ref 3.5–5.1)
SODIUM: 137 mmol/L (ref 135–145)
TOTAL PROTEIN: 5.4 g/dL — AB (ref 6.5–8.1)

## 2015-05-13 LAB — CBC
HCT: 36.4 % (ref 35.0–47.0)
HEMOGLOBIN: 12.1 g/dL (ref 12.0–16.0)
MCH: 29.4 pg (ref 26.0–34.0)
MCHC: 33.3 g/dL (ref 32.0–36.0)
MCV: 88.3 fL (ref 80.0–100.0)
Platelets: 284 10*3/uL (ref 150–440)
RBC: 4.12 MIL/uL (ref 3.80–5.20)
RDW: 13.8 % (ref 11.5–14.5)
WBC: 10.2 10*3/uL (ref 3.6–11.0)

## 2015-05-13 LAB — OCCULT BLOOD X 1 CARD TO LAB, STOOL: Fecal Occult Bld: POSITIVE — AB

## 2015-05-13 SURGERY — ESOPHAGOGASTRODUODENOSCOPY (EGD) WITH PROPOFOL
Anesthesia: General

## 2015-05-13 MED ORDER — SODIUM CHLORIDE 0.9 % IV SOLN
INTRAVENOUS | Status: DC
  Administered 2015-05-13: 1000 mL via INTRAVENOUS

## 2015-05-13 MED ORDER — FENTANYL CITRATE (PF) 100 MCG/2ML IJ SOLN
INTRAMUSCULAR | Status: DC | PRN
  Administered 2015-05-13: 25 ug via INTRAVENOUS

## 2015-05-13 MED ORDER — RISAQUAD PO CAPS
1.0000 | ORAL_CAPSULE | Freq: Every day | ORAL | Status: DC
  Administered 2015-05-14: 11:00:00 1 via ORAL
  Filled 2015-05-13: qty 1

## 2015-05-13 MED ORDER — ONDANSETRON HCL 4 MG/2ML IJ SOLN: 4.0000 mg | Freq: Once | INTRAMUSCULAR | Status: DC | PRN

## 2015-05-13 MED ORDER — PROPOFOL 10 MG/ML IV BOLUS
INTRAVENOUS | Status: DC | PRN
  Administered 2015-05-13: 70 mg via INTRAVENOUS

## 2015-05-13 MED ORDER — FENTANYL CITRATE (PF) 100 MCG/2ML IJ SOLN: 25.0000 ug | INTRAMUSCULAR | Status: DC | PRN

## 2015-05-13 MED ORDER — LIDOCAINE HCL (CARDIAC) 20 MG/ML IV SOLN
INTRAVENOUS | Status: DC | PRN
  Administered 2015-05-13: 100 mg via INTRAVENOUS

## 2015-05-13 NOTE — Progress Notes (Signed)
Physical Therapy:  Pt currently out of room; unable to perform therapy evaluation. Will re-attempt next date if medically stable.  Elizabeth PalauStephanie Green Quincy, PT, DPT [phone]

## 2015-05-13 NOTE — Consult Note (Signed)
[facility]  [address], [place], [place] [address] Phone: [phone] Fax : [phone]  Consultation  Referring Provider:     No ref. provider found Primary Care Physician:  Jeraldine Loots, MD Primary Gastroenterologist:  None        Reason for Consultation:     Melena  Date of Admission:  05/12/2015 Date of Consultation:  05/13/2015         HPI:   Monica Warren is a 82 y.o. female who was admitted with a history of diverticulitis where she had been admitted to the [facility] approximate 2 weeks ago. The patient reports that she did not have any nausea vomiting fevers or chills during this episode and was sent to rehabilitation. The patient now reports she has been having black stools mixed with some bloody stools. The patient has no abdominal pain fevers chills nausea or vomiting and this admission.. The patient had a GI panel back on April 9 that was negative for any pathogens including C. difficile. The patient's family reports that the patient had been feeling weak recently. There is no report of any unexplained weight loss.  Past Medical History  Diagnosis Date  . Asthma   . Hypertension     History reviewed. No pertinent past surgical history.  Prior to Admission medications   Medication Sig Start Date End Date Taking? Authorizing Provider  acetaminophen (TYLENOL) 325 MG tablet Take 650 mg by mouth every 6 (six) hours as needed for mild pain or headache.   Yes Historical Provider, MD  albuterol (PROVENTIL HFA;VENTOLIN HFA) 108 (90 BASE) MCG/ACT inhaler Inhale 4-6 puffs by mouth every 4 hours as needed for wheezing, cough, and/or shortness of breath 06/24/14  Yes Hinda Kehr, MD  albuterol (PROVENTIL) (2.5 MG/3ML) 0.083% nebulizer solution Take 2.5 mg by nebulization every 4 (four) hours as needed for wheezing or shortness of breath.   Yes Historical Provider, MD  amLODipine (NORVASC) 5 MG tablet Take 5 mg by mouth daily.   Yes Historical Provider,  MD  aspirin EC 81 MG tablet Take 81 mg by mouth daily.   Yes Historical Provider, MD  Cyanocobalamin 1000 MCG/ML KIT Inject 1,000 mcg/mL as directed every 30 (thirty) days.   Yes Historical Provider, MD  enalapril (VASOTEC) 20 MG tablet Take 20 mg by mouth daily.   Yes Historical Provider, MD  furosemide (LASIX) 20 MG tablet Take 20 mg by mouth.   Yes Historical Provider, MD  gabapentin (NEURONTIN) 300 MG capsule Take 900 mg by mouth every evening.   Yes Historical Provider, MD  levothyroxine (SYNTHROID, LEVOTHROID) 125 MCG tablet Take 125 mcg by mouth daily before breakfast.   Yes Historical Provider, MD  PARoxetine (PAXIL) 40 MG tablet Take 40 mg by mouth daily.   Yes Historical Provider, MD  torsemide (DEMADEX) 20 MG tablet Take 20 mg by mouth 2 (two) times daily.   Yes Historical Provider, MD    History reviewed. No pertinent family history.   Social History  Substance Use Topics  . Smoking status: Former Research scientist (life sciences)  . Smokeless tobacco: None  . Alcohol Use: No    Allergies as of 05/12/2015  . (No Known Allergies)    Review of Systems:    All systems reviewed and negative except where noted in HPI.   Physical Exam:  Vital signs in last 24 hours: Temp:  [98 F (36.7 C)-99.4 F (37.4 C)] 99.4 F (37.4 C) (04/21 1348) Pulse Rate:  [69-82] 73 (04/21 1348) Resp:  [  16-26] 18 (04/21 1348) BP: (138-183)/(43-75) 143/58 mmHg (04/21 1348) SpO2:  [92 %-99 %] 96 % (04/21 1348) Weight:  [225 lb 3.2 oz (102.15 kg)] 225 lb 3.2 oz (102.15 kg) (04/20 1951) Last BM Date: 05/13/15 General:   Pleasant, cooperative in NAD, Obese, Head:  Normocephalic and atraumatic. Eyes:   No icterus.   Conjunctiva pink. PERRLA. Ears:  Normal auditory acuity. Neck:  Supple; no masses or thyroidomegaly Lungs: Respirations even and unlabored. Lungs clear to auscultation bilaterally.   No wheezes, crackles, or rhonchi.  Heart:  Regular rate and rhythm;  Without murmur, clicks, rubs or gallops Abdomen:  Soft,  nondistended, nontender. Normal bowel sounds. No appreciable masses or hepatomegaly.  No rebound or guarding.  Rectal:  Not performed. Msk:  Symmetrical without gross deformities.   Extremities:  Without edema, cyanosis or clubbing. Neurologic:  Alert and oriented x3;  grossly normal neurologically. Skin:  Intact without significant lesions or rashes. Cervical Nodes:  No significant cervical adenopathy. Psych:  Alert and cooperative. Normal affect.  LAB RESULTS:  Recent Labs  05/12/15 1232 05/12/15 2033 05/13/15 0742  WBC 11.4*  --  10.2  HGB 13.2 12.8 12.1  HCT 39.8 39.4 36.4  PLT 272  --  284   BMET  Recent Labs  05/12/15 1232 05/13/15 0742  NA 137 137  K 4.1 3.8  CL 104 103  CO2 28 28  GLUCOSE 98 113*  BUN 9 7  CREATININE 0.94 0.97  CALCIUM 8.7* 8.4*   LFT  Recent Labs  05/13/15 0742  PROT 5.4*  ALBUMIN 2.4*  AST 18  ALT 11*  ALKPHOS 56  BILITOT 0.8   PT/INR  Recent Labs  05/12/15 1232  LABPROT 13.8  INR 1.04    STUDIES: No results found.    Impression / Plan:   Monica Warren is a 82 y.o. y/o female with With a history of diverticulitis who was on antibiotics but now comes in with melanotic stools and some intermittent bloody stools. The patient's hemoglobin was 12.8 on admission and is 12.1 today. The patient will be set up for a upper endoscopy today and will likely need a colonoscopy at some time in the future. I have discussed risks & benefits which include, but are not limited to, bleeding, infection, perforation & drug reaction.  The patient agrees with this plan & written consent will be obtained.      Thank you for involving me in the care of this patient.        Ollen Bowl, MD  05/13/2015, 2:34 PM   Note: This dictation was prepared with Dragon dictation along with smaller phrase technology. Any transcriptional errors that result from this process are unintentional.

## 2015-05-13 NOTE — Care Management Obs Status (Signed)
MEDICARE OBSERVATION STATUS NOTIFICATION   Patient Details  Name: Monica Warren MRN: [id number] Date of Birth: 05/07/1932   Medicare Observation Status Notification Given:  Yes    Gwenette GreetBrenda S Byrdie Miyazaki, RN 05/13/2015, 9:58 AM

## 2015-05-13 NOTE — Transfer of Care (Signed)
Immediate Anesthesia Transfer of Care Note  Patient: Monica Warren  Procedure(s) Performed: Procedure(s): ESOPHAGOGASTRODUODENOSCOPY (EGD) WITH PROPOFOL (N/A)  Patient Location: PACU  Anesthesia Type:General  Level of Consciousness: awake  Airway & Oxygen Therapy: Patient Spontanous Breathing and Patient connected to nasal cannula oxygen  Post-op Assessment: Report given to RN and Post -op Vital signs reviewed and stable  Post vital signs: Reviewed and stable  Last Vitals:  Filed Vitals:   05/13/15 1348 05/13/15 1457  BP: 143/58 116/37  Pulse: 73 67  Temp: 37.4 C 36.1 C  Resp: 18 15    Complications: No apparent anesthesia complications

## 2015-05-13 NOTE — Op Note (Signed)
[facility] Gastroenterology Patient Name: Monica Warren Procedure Date: 05/13/2015 2:20 PM MRN: [id number] Account #: [id number] Date of Birth: 24-Jun-1932 Admit Type: Inpatient Age: 82 Room: [facility] ENDO ROOM 4 Gender: Female Note Status: Finalized Procedure:            Upper GI endoscopy Indications:          Melena Providers:            Midge Minium, MD Referring MD:         Donell Sievert, MD (Referring MD) Medicines:            Propofol per Anesthesia Complications:        No immediate complications. Procedure:            Pre-Anesthesia Assessment:                       - Prior to the procedure, a History and Physical was                        performed, and patient medications and allergies were                        reviewed. The patient's tolerance of previous                        anesthesia was also reviewed. The risks and benefits of                        the procedure and the sedation options and risks were                        discussed with the patient. All questions were                        answered, and informed consent was obtained. Prior                        Anticoagulants: The patient has taken no previous                        anticoagulant or antiplatelet agents. ASA Grade                        Assessment: II - A patient with mild systemic disease.                        After reviewing the risks and benefits, the patient was                        deemed in satisfactory condition to undergo the                        procedure.                       After obtaining informed consent, the endoscope was                        passed under direct vision. Throughout the procedure,  the patient's blood pressure, pulse, and oxygen                        saturations were monitored continuously. The Endoscope                        was introduced through the mouth, and advanced to the                        second  part of duodenum. The upper GI endoscopy was                        accomplished without difficulty. The patient tolerated                        the procedure well. Findings:      A large hiatal hernia was present.      The stomach was normal.      The examined duodenum was normal. Impression:           - Large hiatal hernia.                       - Normal stomach.                       - Normal examined duodenum.                       - No specimens collected. Recommendation:       - Return patient to hospital ward for ongoing care. Procedure Code(s):    --- Professional ---                       [id number], Esophagogastroduodenoscopy, flexible, transoral;                        diagnostic, including collection of specimen(s) by                        brushing or washing, when performed (separate procedure) Diagnosis Code(s):    --- Professional ---                       K92.1, Melena (includes Hematochezia) CPT copyright 2016 American Medical Association. All rights reserved. The codes documented in this report are preliminary and upon coder review may  be revised to meet current compliance requirements. Midge Miniumarren Martese Vanatta, MD 05/13/2015 2:49:57 PM This report has been signed electronically. Number of Addenda: 0 Note Initiated On: 05/13/2015 2:20 PM      [facility]

## 2015-05-13 NOTE — Clinical Social Work Note (Signed)
CSW consulted as [facility] was [facility]. CSW spoke with facility and pt is able to return on Saturday. Full assessment to follow.   Dede QuerySarah Aleja Yearwood, MSW, LCSW  Clinical Social Worker  [phone]

## 2015-05-13 NOTE — Care Management (Signed)
Admitted to [facility] with the diagnosis of melena. Daughter is Angelique BlonderDenise [phone]). A [facility]- 05/03/15. [facility]. [facility] too. Self Feed and self bath, needs helps with dressing. No home oxygen. Nebulizer in the home. Uses a rolling walker and furniture walks in the home. Shower chair, raised toilet seat in the home. Followed by [facility] when in the home. Fell awaile back. Good appetite. No Life Alert.  Gwenette GreetBrenda S Meloney Feld RN MSN CCM Care Management [phone]

## 2015-05-13 NOTE — Anesthesia Preprocedure Evaluation (Addendum)
Anesthesia Evaluation  Patient identified by MRN, date of birth, ID band Patient awake    Reviewed: Allergy & Precautions, NPO status , Patient's Chart, lab work & pertinent test results  Airway Mallampati: II       Dental  (+) Edentulous Upper   Pulmonary asthma , former smoker,   Slight end-expiratory wheeze Pulmonary exam normal  + wheezing      Cardiovascular hypertension, Pt. on medications Normal cardiovascular exam     Neuro/Psych negative neurological ROS  negative psych ROS   GI/Hepatic Neg liver ROS,   Endo/Other  negative endocrine ROS  Renal/GU negative Renal ROS  negative genitourinary   Musculoskeletal negative musculoskeletal ROS (+)   Abdominal Normal abdominal exam  (+)   Peds negative pediatric ROS (+)  Hematology negative hematology ROS (+)   Anesthesia Other Findings   Reproductive/Obstetrics                            Anesthesia Physical Anesthesia Plan  ASA: III  Anesthesia Plan: General   Post-op Pain Management:    Induction: Intravenous  Airway Management Planned: Nasal Cannula  Additional Equipment:   Intra-op Plan:   Post-operative Plan:   Informed Consent: I have reviewed the patients History and Physical, chart, labs and discussed the procedure including the risks, benefits and alternatives for the proposed anesthesia with the patient or authorized representative who has indicated his/her understanding and acceptance.   Dental advisory given  Plan Discussed with: CRNA and Surgeon  Anesthesia Plan Comments:         Anesthesia Quick Evaluation

## 2015-05-13 NOTE — Anesthesia Postprocedure Evaluation (Signed)
Anesthesia Post Note  Patient: Monica Warren  Procedure(s) Performed: Procedure(s) (LRB): ESOPHAGOGASTRODUODENOSCOPY (EGD) WITH PROPOFOL (N/A)  Patient location during evaluation: PACU Anesthesia Type: General Level of consciousness: awake and alert and oriented Pain management: pain level controlled Vital Signs Assessment: post-procedure vital signs reviewed and stable Respiratory status: spontaneous breathing Cardiovascular status: blood pressure returned to baseline Anesthetic complications: no    Last Vitals:  Filed Vitals:   05/13/15 1500 05/13/15 1506  BP: 107/46 124/62  Pulse: 61 65  Temp:    Resp: 18 16    Last Pain:  Filed Vitals:   05/13/15 1508  PainSc: 0-No pain                 Fines Kimberlin

## 2015-05-13 NOTE — Anesthesia Procedure Notes (Signed)
Date/Time: 05/13/2015 2:45 PM Performed by: Henrietta HooverPOPE, Jahari Wiginton Pre-anesthesia Checklist: Patient identified, Emergency Drugs available, Suction available, Patient being monitored and Timeout performed Patient Re-evaluated:Patient Re-evaluated prior to inductionOxygen Delivery Method: Nasal cannula Intubation Type: IV induction

## 2015-05-13 NOTE — Progress Notes (Signed)
Patient ID: Monica Warren, female   DOB: 10/10/1932, 82 y.o.   MRN: [id number] [facility] [place] [facility]   PATIENT NAME: Monica Warren    MR#:  [id number]  DATE OF BIRTH:  01/31/1932  SUBJECTIVE:  Patient seen in the endoscopy suite. She came in with some bloody diarrhea. She is denying any symptoms at present.  REVIEW OF SYSTEMS:   Review of Systems  Constitutional: Negative for fever, chills and weight loss.  HENT: Negative for ear discharge, ear pain and nosebleeds.   Eyes: Negative for blurred vision, pain and discharge.  Respiratory: Negative for sputum production, shortness of breath, wheezing and stridor.   Cardiovascular: Negative for chest pain, palpitations, orthopnea and PND.  Gastrointestinal: Positive for blood in stool. Negative for nausea, vomiting, abdominal pain and diarrhea.  Genitourinary: Negative for urgency and frequency.  Musculoskeletal: Negative for back pain and joint pain.  Neurological: Negative for sensory change, speech change, focal weakness and weakness.  Psychiatric/Behavioral: Negative for depression and hallucinations. The patient is not nervous/anxious.   All other systems reviewed and are negative.  Tolerating Diet:yes Tolerating PT: pendig  DRUG ALLERGIES:  No Known Allergies  VITALS:  Blood pressure 139/45, pulse 73, temperature 96.9 F (36.1 C), temperature source Tympanic, resp. rate 19, height 5\' 7"  (1.702 m), weight 102.15 kg (225 lb 3.2 oz), SpO2 100 %.  PHYSICAL EXAMINATION:   Physical Exam  GENERAL:  82 y.o.-year-old patient lying in the bed with no acute distress. obese EYES: Pupils equal, round, reactive to light and accommodation. No scleral icterus. Extraocular muscles intact.  HEENT: Head atraumatic, normocephalic. Oropharynx and nasopharynx clear.  NECK:  Supple, no jugular venous distention. No thyroid enlargement, no tenderness.  LUNGS: Normal breath sounds bilaterally, no  wheezing, rales, rhonchi. No use of accessory muscles of respiration.  CARDIOVASCULAR: S1, S2 normal. No murmurs, rubs, or gallops.  ABDOMEN: Soft, nontender, nondistended. Bowel sounds present. No organomegaly or mass.  EXTREMITIES: No cyanosis, clubbing or edema b/l.    NEUROLOGIC: Cranial nerves II through XII are intact. No focal Motor or sensory deficits b/l.  Gn weakness PSYCHIATRIC:  patient is alert and oriented x 3.  SKIN: No obvious rash, lesion, or ulcer.   LABORATORY PANEL:  CBC  Recent Labs Lab 05/13/15 0742  WBC 10.2  HGB 12.1  HCT 36.4  PLT 284    Chemistries   Recent Labs Lab 05/13/15 0742  NA 137  K 3.8  CL 103  CO2 28  GLUCOSE 113*  BUN 7  CREATININE 0.97  CALCIUM 8.4*  AST 18  ALT 11*  ALKPHOS 56  BILITOT 0.8   ASSESSMENT AND PLAN:  Monica Warren is a 82 y.o. female with a known history of essential hypertension asthma, hypothyroidism and obesity is brought into the ED due to multiple episodes of bloody stool at [facility] Patient was recently diagnosed with diverticulitis and has been having diarrhea for approximately 2 weeks after completing the [facility] on [facility]. Patient denies any abdominal pain, nausea or vomiting.  # GI bleed-reporting melena and bloody stools- could be diverticular bleed but would not explain melena Hemoglobin is stable at this time and patient is also hemodynamically stable Monitor hemoglobin and hematocrit closely. Liquid diet, PPI GI consult appreciated. Patient underwent EGD that showed large hiatal hernia. Rest normal.  Currently patient is not symptomatic so not considering any CT abdomen at this point GI recommends colonoscopy as outpatient.  #Chronic history of hypothyroidism  continue Synthroid  #Essential hypertension blood pressure is elevated.  -Resume her home medications and give Lopressor IV when necessary  #Morbid obesity-patient will be benefited with lifestyle changes  once patient is medically stable  The patient remains hemodynamically stable we'll discharge her to [facility] tomorrow.  Case discussed with Care Management/Social Worker. Management plans discussed with the patient, family and they are in agreement.  CODE STATUS: full  DVT Prophylaxis: Teds and SCDs.   TOTAL TIME TAKING CARE OF THIS PATIENT:30* minutes.  >50% time spent on counselling and coordination of care  POSSIBLE D/C IN 1 DAYS, DEPENDING ON CLINICAL CONDITION.  Note: This dictation was prepared with Dragon dictation along with smaller phrase technology. Any transcriptional errors that result from this process are unintentional.  Cleavon Goldman M.D on 05/13/2015 at 3:39 PM  Between 7am to 6pm - Pager - [phone]  After 6pm go to [email or web address] - password [email or web address] [facility]  [place] [place]  Office  [phone]  CC: Primary care physician; Donell Sievert, MD

## 2015-05-14 LAB — GLUCOSE, CAPILLARY
GLUCOSE-CAPILLARY: 113 mg/dL — AB (ref 65–99)
GLUCOSE-CAPILLARY: 133 mg/dL — AB (ref 65–99)

## 2015-05-14 NOTE — Clinical Social Work Note (Signed)
CSW was able to reach patient's son: Mr. Monica Warren and he is aware and in agreement with [facility]. York SpanielMonica Yamilka Lopiccolo MSW,LCSW [phone]

## 2015-05-14 NOTE — Evaluation (Signed)
Physical Therapy Evaluation Patient Details Name: Monica Warren MRN: [id number] DOB: 09-25-1932 Today's Date: 05/14/2015   History of Present Illness  Pt has been at rehab for ~10 days and then had bloody stool.  She is eager to get back to rehab.  Clinical Impression  Pt showed good effort and in bed mobility, but was very limited with her tolerance with standing and attempts at ambulation.  She was fearful to do more than 1 or 2 steps close to the EOB, but did seem motivated to try and work with PT.  Pt eager to get back to rehab.     Follow Up Recommendations SNF    Equipment Recommendations       Recommendations for Other Services       Precautions / Restrictions Precautions Precautions: Fall Restrictions Weight Bearing Restrictions: No      Mobility  Bed Mobility Overal bed mobility: Modified Independent             General bed mobility comments: Pt needs UEs on rails, but does not need direct assist.   Transfers Overall transfer level: Modified independent Equipment used: Rolling walker (2 wheeled)             General transfer comment: Pt initially unsure of her ability to stand and requests PT to raise bed, but ultimately she is able to get to standing with only CGA and cuing.   Ambulation/Gait Ambulation/Gait assistance: Min assist Ambulation Distance (Feet): 2 Feet Assistive device: Rolling walker (2 wheeled)       General Gait Details: Pt is very limited with how much standing time/steps she can take, but she does 2 side steps and 1 forward step with only min assist.  She fatigues significantly with this minimal exertion.   Stairs            Wheelchair Mobility    Modified Rankin (Stroke Patients Only)       Balance                                             Pertinent Vitals/Pain Pain Assessment:  (chronic R shoulder pain)    Home Living Family/patient expects to be discharged to:: Skilled nursing  facility               Home Equipment: Wheelchair - manual;Walker - 2 wheels      Prior Function Level of Independence: Needs assistance;Independent with assistive device(s)         Comments: Pt reports that she has not been getting out of the house much.      Hand Dominance        Extremity/Trunk Assessment   Upper Extremity Assessment: RUE deficits/detail RUE Deficits / Details: Pt with chronic R shoulder/RTC limitations.  No functional shoulder elevation, even with AAROM.          Lower Extremity Assessment: Generalized weakness         Communication   Communication: No difficulties  Cognition Arousal/Alertness: Awake/alert Behavior During Therapy: WFL for tasks assessed/performed Overall Cognitive Status: Within Functional Limits for tasks assessed                      General Comments      Exercises        Assessment/Plan    PT Assessment Patient needs continued PT services  PT Diagnosis  Difficulty walking;Generalized weakness   PT Problem List Decreased strength;Decreased range of motion;Decreased activity tolerance;Decreased balance;Decreased mobility;Decreased knowledge of use of DME;Decreased safety awareness;Pain  PT Treatment Interventions DME instruction;Gait training;Functional mobility training;Therapeutic activities;Therapeutic exercise;Balance training;Patient/family education   PT Goals (Current goals can be found in the Care Plan section) Acute Rehab PT Goals Patient Stated Goal: Go back to rehab and get stronger PT Goal Formulation: With patient Time For Goal Achievement: 05/28/15 Potential to Achieve Goals: Fair    Frequency Min 2X/week   Barriers to discharge        Co-evaluation               End of Session Equipment Utilized During Treatment: Gait belt Activity Tolerance: Patient limited by fatigue Patient left: with nursing/sitter in room;with bed alarm set;with call bell/phone within reach       Functional Assessment Tool Used: clinical judgement Functional Limitation: Mobility: Walking and moving around Mobility: Walking and Moving Around Current Status [phone](G8978): At least 60 percent but less than 80 percent impaired, limited or restricted Mobility: Walking and Moving Around Goal Status [phone](G8979): At least 20 percent but less than 40 percent impaired, limited or restricted    Time: 0900-0925 PT Time Calculation (min) (ACUTE ONLY): 25 min   Charges:   PT Evaluation $PT Eval Low Complexity: 1 Procedure     PT G Codes:   PT G-Codes **NOT FOR INPATIENT CLASS** Functional Assessment Tool Used: clinical judgement Functional Limitation: Mobility: Walking and moving around Mobility: Walking and Moving Around Current Status ([id number](G8978): At least 60 percent but less than 80 percent impaired, limited or restricted Mobility: Walking and Moving Around Goal Status [phone](G8979): At least 20 percent but less than 40 percent impaired, limited or restricted   Monica Warren, PT, DPT [id number]  Malachi ProGalen R Fortino Warren 05/14/2015, 2:01 PM

## 2015-05-14 NOTE — NC FL2 (Signed)
MEDICAID [id number] LEVEL OF CARE SCREENING TOOL     IDENTIFICATION  Patient Name: Monica Warren Birthdate: 06/03/1932 Sex: female Admission Date (Current Location): 05/12/2015  [place]   Facility and Address:  [facility], [address], [place], [place] [address]      Provider Number: [id number]  Attending Physician Name and Address:  Enedina FinnerSona Patel, MD  Relative Name and Phone Number:       Current Level of Care: Hospital Recommended Level of Care: Skilled Nursing Facility Prior Approval Number:    Date Approved/Denied:   PASRR Number: existing  Discharge Plan: SNF    Current Diagnoses: Patient Active Problem List   Diagnosis Date Noted  . Acute GI bleeding   . Melena 05/12/2015    Orientation RESPIRATION BLADDER Height & Weight        Normal Incontinent Weight: 225 lb 3.2 oz (102.15 kg) Height:  5\' 7"  (170.2 cm)  BEHAVIORAL SYMPTOMS/MOOD NEUROLOGICAL BOWEL NUTRITION STATUS   (none)  (none) Incontinent Diet (diet soft)  AMBULATORY STATUS COMMUNICATION OF NEEDS Skin   Extensive Assist Verbally Normal                       Personal Care Assistance Level of Assistance  Bathing, Dressing           Functional Limitations Info             SPECIAL CARE FACTORS FREQUENCY                       Contractures Contractures Info: Not present    Additional Factors Info                  Current Medications (05/14/2015):  This is the current hospital active medication list Current Facility-Administered Medications  Medication Dose Route Frequency Provider Last Rate Last Dose  . 0.9 %  sodium chloride infusion  250 mL Intravenous PRN Ramonita LabAruna Gouru, MD      . acetaminophen (TYLENOL) tablet 650 mg  650 mg Oral Q6H PRN Ramonita LabAruna Gouru, MD      . acidophilus (RISAQUAD) capsule 1 capsule  1 capsule Oral Daily Enedina FinnerSona Patel, MD      . albuterol (PROVENTIL) (2.5 MG/3ML) 0.083% nebulizer  solution 2.5 mg  2.5 mg Nebulization Q4H PRN Ramonita LabAruna Gouru, MD   2.5 mg at 05/12/15 2056  . amLODipine (NORVASC) tablet 5 mg  5 mg Oral Daily Ramonita LabAruna Gouru, MD   5 mg at 05/12/15 2027  . enalapril (VASOTEC) tablet 20 mg  20 mg Oral Daily Ramonita LabAruna Gouru, MD   20 mg at 05/12/15 2027  . fentaNYL (SUBLIMAZE) injection 25 mcg  25 mcg Intravenous Q5 min PRN Yves DillPaul Carroll, MD      . gabapentin (NEURONTIN) capsule 900 mg  900 mg Oral QPM Ramonita LabAruna Gouru, MD   900 mg at 05/13/15 1830  . levothyroxine (SYNTHROID, LEVOTHROID) tablet 125 mcg  125 mcg Oral QAC breakfast Ramonita LabAruna Gouru, MD   125 mcg at 05/13/15 1149  . metoprolol (LOPRESSOR) injection 5 mg  5 mg Intravenous Q4H PRN Ramonita LabAruna Gouru, MD      . ondansetron (ZOFRAN) injection 4 mg  4 mg Intravenous Once PRN Yves DillPaul Carroll, MD      . oxyCODONE (Oxy IR/ROXICODONE) immediate release tablet 5 mg  5 mg Oral Q4H PRN Ramonita LabAruna Gouru, MD      . pantoprazole (PROTONIX) injection 40 mg  40 mg Intravenous Q12H Ramonita Lab, MD   40 mg at 05/13/15 2229  . PARoxetine (PAXIL) tablet 40 mg  40 mg Oral Daily Ramonita Lab, MD   40 mg at 05/12/15 2028  . sodium chloride flush (NS) 0.9 % injection 3 mL  3 mL Intravenous Q12H Ramonita Lab, MD   3 mL at 05/13/15 2229  . sodium chloride flush (NS) 0.9 % injection 3 mL  3 mL Intravenous PRN Ramonita Lab, MD      . torsemide (DEMADEX) tablet 20 mg  20 mg Oral BID Ramonita Lab, MD   20 mg at 05/13/15 1830     Discharge Medications: Please see discharge summary for a list of discharge medications.  Relevant Imaging Results:  Relevant Lab Results:   Additional Information    York Spaniel, LCSW

## 2015-05-14 NOTE — Discharge Summary (Signed)
Monica Warren NAME: Monica Warren    MR#:  [id number]  DATE OF BIRTH:  1932/04/09  DATE OF ADMISSION:  05/12/2015 ADMITTING PHYSICIAN: Monica Mango, MD  DATE OF DISCHARGE: 05/14/15  PRIMARY CARE PHYSICIAN: Monica Loots, MD    ADMISSION DIAGNOSIS:  Acute GI bleeding [K92.2]  DISCHARGE DIAGNOSIS:  GI bleed resolved. Upper GI endoscopy no source of bleeding. Large hiatal hernia. Likely diverticular  SECONDARY DIAGNOSIS:   Past Medical History  Diagnosis Date  . Asthma   . Hypertension     HOSPITAL COURSE:  Monica Warren is a 82 y.o. female with a known history of essential hypertension asthma, hypothyroidism and obesity is brought into the ED due to multiple episodes of bloody stool at [facility] Patient was recently diagnosed with diverticulitis and has been having diarrhea for approximately 2 weeks after completing the antibiotic course given at [facility] on 05/01/2015. Patient denies any abdominal pain, nausea or vomiting.  # GI bleed-reporting melena and bloody stools- could be diverticular bleed but would not explain melena Hemoglobin is stable at this time and patient is also hemodynamically stable Liquid diet, PPI GI consult appreciated. Patient underwent EGD that showed large hiatal hernia. Rest normal.  Currently patient is not symptomatic so not considering any CT abdomen at this point GI recommends colonoscopy as outpatient.  #Chronic history of hypothyroidism continue Synthroid  #Essential hypertension blood pressure is elevated.  -continue  her home medications   #Morbid obesity  The patient remains hemodynamically stable we'll discharge her to [facility] today  Left message for patient's daughter Monica Warren  CONSULTS OBTAINED:  Treatment Team:  Monica Lame, MD  DRUG ALLERGIES:  No Known Allergies  DISCHARGE MEDICATIONS:   Current Discharge Medication List    CONTINUE these  medications which have NOT CHANGED   Details  acetaminophen (TYLENOL) 325 MG tablet Take 650 mg by mouth every 6 (six) hours as needed for mild pain or headache.    albuterol (PROVENTIL HFA;VENTOLIN HFA) 108 (90 BASE) MCG/ACT inhaler Inhale 4-6 puffs by mouth every 4 hours as needed for wheezing, cough, and/or shortness of breath Qty: 1 Inhaler, Refills: 1    albuterol (PROVENTIL) (2.5 MG/3ML) 0.083% nebulizer solution Take 2.5 mg by nebulization every 4 (four) hours as needed for wheezing or shortness of breath.    amLODipine (NORVASC) 5 MG tablet Take 5 mg by mouth daily.    aspirin EC 81 MG tablet Take 81 mg by mouth daily.    Cyanocobalamin 1000 MCG/ML KIT Inject 1,000 mcg/mL as directed every 30 (thirty) days.    enalapril (VASOTEC) 20 MG tablet Take 20 mg by mouth daily.    furosemide (LASIX) 20 MG tablet Take 20 mg by mouth.    gabapentin (NEURONTIN) 300 MG capsule Take 900 mg by mouth every evening.    levothyroxine (SYNTHROID, LEVOTHROID) 125 MCG tablet Take 125 mcg by mouth daily before breakfast.    PARoxetine (PAXIL) 40 MG tablet Take 40 mg by mouth daily.    torsemide (DEMADEX) 20 MG tablet Take 20 mg by mouth 2 (two) times daily.        If you experience worsening of your admission symptoms, develop shortness of breath, life threatening emergency, suicidal or homicidal thoughts you must seek medical attention immediately by calling 911 or calling your MD immediately  if symptoms less severe.  You Must read complete instructions/literature along with all the possible adverse reactions/side effects for all the Medicines you take and  that have been prescribed to you. Take any new Medicines after you have completely understood and accept all the possible adverse reactions/side effects.   Please note  You were cared for by a hospitalist during your hospital stay. If you have any questions about your discharge medications or the care you received while you were in the  hospital after you are discharged, you can call the unit and asked to speak with the hospitalist on call if the hospitalist that took care of you is not available. Once you are discharged, your primary care physician will handle any further medical issues. Please note that NO REFILLS for any discharge medications will be authorized once you are discharged, as it is imperative that you return to your primary care physician (or establish a relationship with a primary care physician if you do not have one) for your aftercare needs so that they can reassess your need for medications and monitor your lab values. Today   SUBJECTIVE   Denies any complaints. No bloody or melanotic stools. Tolerating diet.  VITAL SIGNS:  Blood pressure 148/49, pulse 72, temperature 98.4 F (36.9 C), temperature source Oral, resp. rate 20, height _0  (1.702 m), weight 102.15 kg (225 lb 3.2 oz), SpO2 92 %.  I/O:   Intake/Output Summary (Last 24 hours) at 05/14/15 0915 Last data filed at 05/13/15 1445  Gross per 24 hour  Intake    200 ml  Output      0 ml  Net    200 ml    PHYSICAL EXAMINATION:  GENERAL:  82 y.o.-year-old patient lying in the bed with no acute distress. Morbid obesity  EYES: Pupils equal, round, reactive to light and accommodation. No scleral icterus. Extraocular muscles intact.  HEENT: Head atraumatic, normocephalic. Oropharynx and nasopharynx clear.  NECK:  Supple, no jugular venous distention. No thyroid enlargement, no tenderness.  LUNGS: Normal breath sounds bilaterally, no wheezing, rales,rhonchi or crepitation. No use of accessory muscles of respiration.  CARDIOVASCULAR: S1, S2 normal. No murmurs, rubs, or gallops.  ABDOMEN: Soft, non-tender, non-distended. Bowel sounds present. No organomegaly or mass.  EXTREMITIES: No pedal edema, cyanosis, or clubbing.  NEUROLOGIC: Cranial nerves II through XII are intact. Muscle strength 5/5 in all extremities. Sensation intact. Gait not checked.   PSYCHIATRIC: The patient is alert and oriented x 3.  SKIN: No obvious rash, lesion, or ulcer.   DATA REVIEW:   CBC   Recent Labs Lab 05/13/15 0742  WBC 10.2  HGB 12.1  HCT 36.4  PLT 284    Chemistries   Recent Labs Lab 05/13/15 0742  NA 137  K 3.8  CL 103  CO2 28  GLUCOSE 113*  BUN 7  CREATININE 0.97  CALCIUM 8.4*  AST 18  ALT 11*  ALKPHOS 56  BILITOT 0.8    Microbiology Results   Recent Results (from the past 240 hour(s))  C difficile quick scan w PCR reflex     Status: None   Collection Time: 05/12/15  6:47 PM  Result Value Ref Range Status   C Diff antigen NEGATIVE NEGATIVE Final   C Diff toxin NEGATIVE NEGATIVE Final   C Diff interpretation Negative for C. difficile  Final  MRSA PCR Screening     Status: None   Collection Time: 05/12/15  8:25 PM  Result Value Ref Range Status   MRSA by PCR NEGATIVE NEGATIVE Final    Comment:        The GeneXpert MRSA Assay (FDA approved for NASAL  specimens only), is one component of a comprehensive MRSA colonization surveillance program. It is not intended to diagnose MRSA infection nor to guide or monitor treatment for MRSA infections.     RADIOLOGY:  No results found.   Management plans discussed with the patient, family and they are in agreement.  CODE STATUS:     Code Status Orders        Start     Ordered   05/12/15 1949  Full code   Continuous     05/12/15 1948    Code Status History    Date Active Date Inactive Code Status Order ID Comments User Context   This patient has a current code status but no historical code status.    Advance Directive Documentation        Most Recent Value   Type of Advance Directive  Healthcare Power of Attorney, Living will   Pre-existing out of facility DNR order (yellow form or pink MOST form)     "MOST" Form in Place?        TOTAL TIME TAKING CARE OF THIS PATIENT: 50mnutes.    Horace Lukas M.D on 05/14/2015 at 9:15 AM  Between 7am to 6pm - Pager -  [phone] After 6pm go to [email or web address] - password [email or web address] [facility]  Office  [phone] CC: Primary care physician; MJeraldine Loots MD

## 2015-05-14 NOTE — Plan of Care (Signed)
Pt d/ced back to [facility] for rehab.  No more bloody stools - Hgb 12.1 down from 12.8.  Reported to Holly BodilyJulia Massey, RN. Pt had no c/o pain.  Upper endo yesterday showed large hiatal hernia otherwise WNL.  May want to have colonoscopy outpt. Pt Cdiff neg and MRSA by PCR negative.  Pt leaving via EMS.

## 2015-05-14 NOTE — Clinical Social Work Note (Signed)
Clinical Social Work Assessment  Patient Details  Name: Monica Warren MRN: [id number] Date of Birth: 04/03/1932  Date of referral:  05/14/15               Reason for consult:  Facility Placement                Permission sought to share information with:  Facility Medical sales representativeContact Representative, Family Supports Permission granted to share information::  Yes, Verbal Permission Granted  Name::        Agency::     Relationship::     Contact Information:     Housing/Transportation Living arrangements for the past 2 months:  Skilled Building surveyorursing Facility Source of Information:  Patient Patient Interpreter Needed:  None Criminal Activity/Legal Involvement Pertinent to Current Situation/Hospitalization:  No - Comment as needed Significant Relationships:  Adult Children Lives with:  Facility Resident Do you feel safe going back to the place where you live?  Yes Need for family participation in patient care:  No (Coment)  Care giving concerns:  [facility].   Social Worker assessment / plan:  MD informed CSW that patient is to discharge back to [facility] today. CSW had contacted [facility] yesterday afternoon and they were able to take patient back this weekend. CSW has spoken to patient this morning who is alert and oriented X4 and she stated that she prefers to go by EMS today and not by family. She stated that she does not believe she can stand, ambulate, or sit up in a wheelchair for duration of transport. CSW has contacted patient's daughter: Angelique BlonderDenise and left her a message regarding her mother returning [facility] today.  Employment status:  Retired Database administratornsurance information:  Managed Medicare PT Recommendations:  Not assessed at this time Information / Referral to community resources:     Patient/Family's Response to care:  Patient expressed appreciation for CSW assistance.  Patient/Family's Understanding of and Emotional Response to Diagnosis, Current Treatment, and Prognosis:   Patient is in agreement with returning to [facility] today.   Emotional Assessment Appearance:  Appears stated age Attitude/Demeanor/Rapport:   (pleasant and cooperative) Affect (typically observed):  Accepting, Calm Orientation:  Oriented to Self, Oriented to Place, Oriented to  Time, Oriented to Situation Alcohol / Substance use:  Not Applicable Psych involvement (Current and /or in the community):  No (Comment)  Discharge Needs  Concerns to be addressed:  Care Coordination Readmission within the last 30 days:  No Current discharge risk:  None Barriers to Discharge:  No Barriers Identified   [place] SpanielMonica Keneshia Tena, LCSW 05/14/2015, 9:34 AM

## 2015-05-18 ENCOUNTER — Encounter: Payer: Self-pay | Admitting: Gastroenterology

## 2016-03-14 ENCOUNTER — Inpatient Hospital Stay
Admission: EM | Admit: 2016-03-14 | Discharge: 2016-03-16 | DRG: 194 | Disposition: A | Discharge status: Skilled Nursing Facility | Payer: Medicare Other | Attending: Internal Medicine | Admitting: Internal Medicine

## 2016-03-14 ENCOUNTER — Encounter: Payer: Self-pay | Admitting: Emergency Medicine

## 2016-03-14 ENCOUNTER — Emergency Department: Payer: Medicare Other

## 2016-03-14 DIAGNOSIS — J45909 Unspecified asthma, uncomplicated: Secondary | ICD-10-CM | POA: Diagnosis present

## 2016-03-14 DIAGNOSIS — E876 Hypokalemia: Secondary | ICD-10-CM | POA: Diagnosis present

## 2016-03-14 DIAGNOSIS — N179 Acute kidney failure, unspecified: Secondary | ICD-10-CM | POA: Diagnosis present

## 2016-03-14 DIAGNOSIS — Z833 Family history of diabetes mellitus: Secondary | ICD-10-CM

## 2016-03-14 DIAGNOSIS — Z79899 Other long term (current) drug therapy: Secondary | ICD-10-CM

## 2016-03-14 DIAGNOSIS — Z87891 Personal history of nicotine dependence: Secondary | ICD-10-CM

## 2016-03-14 DIAGNOSIS — J1000 Influenza due to other identified influenza virus with unspecified type of pneumonia: Principal | ICD-10-CM | POA: Diagnosis present

## 2016-03-14 DIAGNOSIS — I10 Essential (primary) hypertension: Secondary | ICD-10-CM | POA: Diagnosis present

## 2016-03-14 DIAGNOSIS — Z66 Do not resuscitate: Secondary | ICD-10-CM | POA: Diagnosis not present

## 2016-03-14 DIAGNOSIS — R0902 Hypoxemia: Secondary | ICD-10-CM | POA: Diagnosis present

## 2016-03-14 DIAGNOSIS — Y95 Nosocomial condition: Secondary | ICD-10-CM | POA: Diagnosis present

## 2016-03-14 DIAGNOSIS — A419 Sepsis, unspecified organism: Secondary | ICD-10-CM

## 2016-03-14 DIAGNOSIS — J189 Pneumonia, unspecified organism: Secondary | ICD-10-CM

## 2016-03-14 DIAGNOSIS — J101 Influenza due to other identified influenza virus with other respiratory manifestations: Secondary | ICD-10-CM | POA: Diagnosis present

## 2016-03-14 LAB — BASIC METABOLIC PANEL
ANION GAP: 10 (ref 5–15)
BUN: 18 mg/dL (ref 6–20)
CALCIUM: 8.9 mg/dL (ref 8.9–10.3)
CO2: 31 mmol/L (ref 22–32)
CREATININE: 1.25 mg/dL — AB (ref 0.44–1.00)
Chloride: 94 mmol/L — ABNORMAL LOW (ref 101–111)
GFR, EST AFRICAN AMERICAN: 45 mL/min — AB (ref >60)
GFR, EST NON AFRICAN AMERICAN: 39 mL/min — AB (ref >60)
Glucose, Bld: 166 mg/dL — ABNORMAL HIGH (ref 65–99)
Potassium: 3.6 mmol/L (ref 3.5–5.1)
SODIUM: 135 mmol/L (ref 135–145)

## 2016-03-14 LAB — DIFFERENTIAL
Basophils Absolute: 0 10*3/uL (ref 0–0.1)
Basophils Relative: 1 %
EOS ABS: 0 10*3/uL (ref 0–0.7)
EOS PCT: 0 %
LYMPHS ABS: 0.5 10*3/uL — AB (ref 1.0–3.6)
Lymphocytes Relative: 8 %
MONO ABS: 0.4 10*3/uL (ref 0.2–0.9)
MONOS PCT: 7 %
NEUTROS PCT: 84 %
Neutro Abs: 4.7 10*3/uL (ref 1.4–6.5)

## 2016-03-14 LAB — URINALYSIS, COMPLETE (UACMP) WITH MICROSCOPIC
BILIRUBIN URINE: NEGATIVE
GLUCOSE, UA: NEGATIVE mg/dL
KETONES UR: NEGATIVE mg/dL
LEUKOCYTES UA: NEGATIVE
NITRITE: NEGATIVE
PROTEIN: NEGATIVE mg/dL
Specific Gravity, Urine: 1.008 (ref 1.005–1.030)
pH: 6 (ref 5.0–8.0)

## 2016-03-14 LAB — CBC
HCT: 35.6 % (ref 35.0–47.0)
HEMOGLOBIN: 12.3 g/dL (ref 12.0–16.0)
MCH: 28.7 pg (ref 26.0–34.0)
MCHC: 34.6 g/dL (ref 32.0–36.0)
MCV: 83 fL (ref 80.0–100.0)
PLATELETS: 198 10*3/uL (ref 150–440)
RBC: 4.29 MIL/uL (ref 3.80–5.20)
RDW: 14.4 % (ref 11.5–14.5)
WBC: 5.9 10*3/uL (ref 3.6–11.0)

## 2016-03-14 LAB — INFLUENZA PANEL BY PCR (TYPE A & B)
INFLAPCR: POSITIVE — AB
Influenza B By PCR: NEGATIVE

## 2016-03-14 LAB — TROPONIN I: Troponin I: 0.03 ng/mL (ref <0.03)

## 2016-03-14 LAB — LACTIC ACID, PLASMA: Lactic Acid, Venous: 0.7 mmol/L (ref 0.5–1.9)

## 2016-03-14 MED ORDER — DEXTROSE 5 % IV SOLN: 1.0000 g | Freq: Once | INTRAVENOUS | Status: DC

## 2016-03-14 MED ORDER — IPRATROPIUM-ALBUTEROL 0.5-2.5 (3) MG/3ML IN SOLN
3.0000 mL | Freq: Once | RESPIRATORY_TRACT | Status: AC
  Administered 2016-03-14: 3 mL via RESPIRATORY_TRACT

## 2016-03-14 MED ORDER — CEFTRIAXONE SODIUM-DEXTROSE 1-3.74 GM-% IV SOLR
1.0000 g | Freq: Once | INTRAVENOUS | Status: AC
  Administered 2016-03-14: 1 g via INTRAVENOUS
  Filled 2016-03-14: qty 50

## 2016-03-14 MED ORDER — IBUPROFEN 600 MG PO TABS
ORAL_TABLET | ORAL | Status: AC
  Administered 2016-03-14: 600 mg via ORAL
  Filled 2016-03-14: qty 1

## 2016-03-14 MED ORDER — DEXTROSE 5 % IV SOLN: 1.0000 g | INTRAVENOUS | Status: DC

## 2016-03-14 MED ORDER — AZITHROMYCIN 500 MG IV SOLR
500.0000 mg | Freq: Once | INTRAVENOUS | Status: AC
  Administered 2016-03-14: 500 mg via INTRAVENOUS
  Filled 2016-03-14: qty 500

## 2016-03-14 MED ORDER — IPRATROPIUM-ALBUTEROL 0.5-2.5 (3) MG/3ML IN SOLN
RESPIRATORY_TRACT | Status: AC
  Administered 2016-03-14: 3 mL via RESPIRATORY_TRACT
  Filled 2016-03-14: qty 3

## 2016-03-14 MED ORDER — AZITHROMYCIN 500 MG IV SOLR: 500.0000 mg | INTRAVENOUS | Status: DC

## 2016-03-14 MED ORDER — OSELTAMIVIR PHOSPHATE 30 MG PO CAPS
30.0000 mg | ORAL_CAPSULE | Freq: Once | ORAL | Status: AC
  Administered 2016-03-14: 30 mg via ORAL
  Filled 2016-03-14: qty 1

## 2016-03-14 MED ORDER — IBUPROFEN 600 MG PO TABS
600.0000 mg | ORAL_TABLET | Freq: Once | ORAL | Status: AC
  Administered 2016-03-14: 600 mg via ORAL

## 2016-03-14 NOTE — ED Provider Notes (Addendum)
[facility] Emergency Department Provider Note ____________________________________________   I have reviewed the triage vital signs and the triage nursing note.  HISTORY  Chief Complaint Pneumonia   Historian Patient, and family members  HPI Monica Warren is a 83 y.o. female with history of asthma and hypertension who is [facility], states that she has had some breathing problems for a few weeks, but was getting worse yesterday and was started on doxycycline by mouth. Patient states that she does not normally wear oxygen. She was feeling worsening dyspnea and it felt like when she had pneumonia, last year. She developed a fever today. Chest pain. She feels weak all over. Symptoms are moderate to severe.    Past Medical History:  Diagnosis Date  . Asthma   . Hypertension     Patient Active Problem List   Diagnosis Date Noted  . Acute GI bleeding   . Melena 05/12/2015    Past Surgical History:  Procedure Laterality Date  . ESOPHAGOGASTRODUODENOSCOPY (EGD) WITH PROPOFOL N/A 05/13/2015   Procedure: ESOPHAGOGASTRODUODENOSCOPY (EGD) WITH PROPOFOL;  Surgeon: Lucilla Lame, MD;  Location: [facility] ENDOSCOPY;  Service: Endoscopy;  Laterality: N/A;    Prior to Admission medications   Medication Sig Start Date End Date Taking? Authorizing Provider  acetaminophen (TYLENOL) 325 MG tablet Take 650 mg by mouth every 6 (six) hours as needed for mild pain or headache.    Historical Provider, MD  albuterol (PROVENTIL HFA;VENTOLIN HFA) 108 (90 BASE) MCG/ACT inhaler Inhale 4-6 puffs by mouth every 4 hours as needed for wheezing, cough, and/or shortness of breath 06/24/14   Hinda Kehr, MD  albuterol (PROVENTIL) (2.5 MG/3ML) 0.083% nebulizer solution Take 2.5 mg by nebulization every 4 (four) hours as needed for wheezing or shortness of breath.    Historical Provider, MD  amLODipine (NORVASC) 5 MG tablet Take 5 mg by mouth daily.    Historical Provider, MD  aspirin EC  81 MG tablet Take 81 mg by mouth daily.    Historical Provider, MD  Cyanocobalamin 1000 MCG/ML KIT Inject 1,000 mcg/mL as directed every 30 (thirty) days.    Historical Provider, MD  enalapril (VASOTEC) 20 MG tablet Take 20 mg by mouth daily.    Historical Provider, MD  furosemide (LASIX) 20 MG tablet Take 20 mg by mouth.    Historical Provider, MD  gabapentin (NEURONTIN) 300 MG capsule Take 900 mg by mouth every evening.    Historical Provider, MD  levothyroxine (SYNTHROID, LEVOTHROID) 125 MCG tablet Take 125 mcg by mouth daily before breakfast.    Historical Provider, MD  PARoxetine (PAXIL) 40 MG tablet Take 40 mg by mouth daily.    Historical Provider, MD  torsemide (DEMADEX) 20 MG tablet Take 20 mg by mouth 2 (two) times daily.    Historical Provider, MD    No Known Allergies  No family history on file.  Social History Social History  Substance Use Topics  . Smoking status: Former Research scientist (life sciences)  . Smokeless tobacco: Never Used  . Alcohol use No    Review of Systems  Constitutional: Positive for fever. Eyes: Negative for visual changes. ENT: Negative for sore throat. Cardiovascular: Negative for chest pain. Respiratory: Positive for shortness of breath. Gastrointestinal: Negative for abdominal pain, vomiting and diarrhea. Genitourinary: Negative for dysuria. Musculoskeletal: Negative for back pain. Skin: Negative for rash. Neurological: Negative for headache. 10 point Review of Systems otherwise negative ____________________________________________   PHYSICAL EXAM:  VITAL SIGNS: ED Triage Vitals  Enc Vitals Group  BP 03/14/16 2048 (!) 178/67     Pulse Rate 03/14/16 2048 90     Resp 03/14/16 2048 (!) 23     Temp 03/14/16 2048 (!) 102 F (38.9 C)     Temp Source 03/14/16 2048 Oral     SpO2 03/14/16 2048 91 %     Weight 03/14/16 2049 218 lb (98.9 kg)     Height 03/14/16 2049 5' 7"  (1.702 m)     Head Circumference --      Peak Flow --      Pain Score 03/14/16 2050 8      Pain Loc --      Pain Edu? --      Excl. in [place]? --      Constitutional: Alert and oriented. Well appearing and in no distress. HEENT   Head: Normocephalic and atraumatic.      Eyes: Conjunctivae are normal. PERRL. Normal extraocular movements.      Ears:         Nose: No congestion/rhinnorhea.   Mouth/Throat: Mucous membranes are moist.   Neck: No stridor. Cardiovascular/Chest: Normal rate, regular rhythm.  No murmurs, rubs, or gallops. Respiratory: Normal respiratory effort without tachypnea nor retractions. Moderate wheezing throughout all lung fields. Some decreased breath sounds at the bases bilaterally. Gastrointestinal: Soft. No distention, no guarding, no rebound. Nontender.  Morbidly obese  Genitourinary/rectal:Deferred Musculoskeletal: Nontender with normal range of motion in all extremities. No joint effusions.  No lower extremity tenderness.  1+ pitting edema bilateral lower extremities. Neurologic:  Normal speech and language. No gross or focal neurologic deficits are appreciated. Skin:  Skin is warm, dry and intact. No rash noted. Psychiatric: Mood and affect are normal. Speech and behavior are normal. Patient exhibits appropriate insight and judgment.   ____________________________________________  LABS (pertinent positives/negatives)  Labs Reviewed  BASIC METABOLIC PANEL - Abnormal; Notable for the following:       Result Value   Chloride 94 (*)    Glucose, Bld 166 (*)    Creatinine, Ser 1.25 (*)    GFR calc non Af Amer 39 (*)    GFR calc Af Amer 45 (*)    All other components within normal limits  URINALYSIS, COMPLETE (UACMP) WITH MICROSCOPIC - Abnormal; Notable for the following:    Color, Urine STRAW (*)    APPearance CLEAR (*)    Hgb urine dipstick SMALL (*)    Bacteria, UA RARE (*)    Squamous Epithelial / LPF 0-5 (*)    All other components within normal limits  DIFFERENTIAL - Abnormal; Notable for the following:    Lymphs Abs 0.5 (*)     All other components within normal limits  CULTURE, BLOOD (ROUTINE X 2)  CULTURE, BLOOD (ROUTINE X 2)  CBC  LACTIC ACID, PLASMA  INFLUENZA PANEL BY PCR (TYPE A & B)  LACTIC ACID, PLASMA  CBG MONITORING, ED    ____________________________________________    EKG I, Lisa Roca, MD, the attending physician have personally viewed and interpreted all ECGs.  87 bpm. Normal sinus rhythm. Narrow QS. Normal axis. Nonspecific T wave, minimal ST segment depression laterally. ____________________________________________  RADIOLOGY All Xrays were viewed by me. Imaging interpreted by Radiologist.  Chest x-ray two-view:  IMPRESSION: Opacity within the lingula suspicious for pneumonia.  These results will be called to the ordering clinician or representative by the Radiologist Assistant, and communication documented in the PACS or zVision Dashboard. __________________________________________  PROCEDURES  Procedure(s) performed: None  Critical Care performed: None  ____________________________________________   ED COURSE / ASSESSMENT AND PLAN  Pertinent labs & imaging results that were available during my care of the patient were reviewed by me and considered in my medical decision making (see chart for details).    Ms. Donahey comes in with worsening shortness of breath and wheezing with a history of asthma, and worsening despite doxycycline for the last 24 hours.  Chest x-ray does show lingular pneumonia. She is febrile to 102. She is to At times as above 24, and hypoxic at rest on the bed dyspneic at 91% room air. On 2 L nasal cannula O2 sat is 94% and she feels a little better.  She is wheezing, but it seems to be the pneumonia may be giving her more trouble than the asthma component. She was given DuoNeb here. I'm holding off on steroids at this point in time. May be deferred to hospitalist team.  Given hypoxia, tachypnea, and source of infection pneumonia with fever,  patient was started on the sepsis pathway and given community acquired pneumonia antibiotics as she is failed outpatient, and is being treated now with Rocephin and azithromycin after blood cultures.  No hypotension. No elevated lactate.  Minimal ST segment depression laterally, I did add on troponin. Troponin and influenza are pending at time of hospitalist consultation.   CONSULTATIONS:  Hospitalist for admission. Patient / Family / Caregiver informed of clinical course, medical decision-making process, and agree with plan.  ___________________________________________   FINAL CLINICAL IMPRESSION(S) / ED DIAGNOSES   Final diagnoses:  Lingular pneumonia  Hypoxia  Sepsis, due to unspecified organism [facility])              Note: This dictation was prepared with Dragon dictation. Any transcriptional errors that result from this process are unintentional    Lisa Roca, MD 03/14/16 [place], MD 03/14/16 2244

## 2016-03-14 NOTE — Progress Notes (Addendum)
Pharmacy Antibiotic Note  Monica Warren is a 83 y.o. female admitted on 03/14/2016 with pneumonia.  Pharmacy has been consulted for vancomycin and cefepime dosing.  Plan: Vancomycin  DW 77kg  Vd 54L kei 0.04 hr-1  T1/2 17 hours 1 gram q 18 hours ordered with stacked dosing. Level before 5th dose. Goal trough 15-20.  Cefepime 2 grams q 12 hours ordered  Height: 5\' 7"  (170.2 cm) Weight: 218 lb (98.9 kg) IBW/kg (Calculated) : 61.6  Temp (24hrs), Avg:102 F (38.9 C), Min:102 F (38.9 C), Max:102 F (38.9 C)   Recent Labs Lab 03/14/16 2051  WBC 5.9  CREATININE 1.25*    Estimated Creatinine Clearance: 42.6 mL/min (by C-G formula based on SCr of 1.25 mg/dL (H)).    No Known Allergies  Antimicrobials this admission: azithromycin ceftriaxone 2/21 >> vancomycin and cefepime 2/22   >>   Dose adjustments this admission:   Microbiology results: 2/21 Blood cx: pending 2/22 Sputum cx: pending     2/21 CXR: lingular opacity 2/21 UA: (-)  Thank you for allowing pharmacy to be a part of this patient's care.  Pansie Guggisberg S 03/14/2016 9:52 PM

## 2016-03-14 NOTE — ED Triage Notes (Signed)
[facility] c/o shortness of breath, fever, and o2 dependency.  Patient diagnosed with pneumonia by facility and started on doxycycline yesterday.  Patient and facility states she has gotten worse since yesterday.  Patient currently at 91% room air with labored breathing present.  Patient did not have x-ray confirmation of the pneumonia but they state she has had 2 other bouts of it recently.  Patient is alert and oriented at this time and is sitting comfortably with 2 L.

## 2016-03-14 NOTE — H&P (Signed)
[place] [facility] [place] NAME: Monica Warren    MR#:  [id number]  DATE OF BIRTH:  24-Aug-1932  DATE OF ADMISSION:  03/14/2016  PRIMARY CARE PHYSICIAN: Jeraldine Loots, MD   REQUESTING/REFERRING PHYSICIAN: Reita Cliche, MD  CHIEF COMPLAINT:   Chief Complaint  Patient presents with  . Pneumonia    HISTORY OF PRESENT ILLNESS:  Monica Warren  is a 83 y.o. female who presents with Recurrent respiratory symptoms who presents with same today. She states that she has been diagnosed with pneumonia in the outpatient setting 3 times over the last 4 months. She states that she has not been able to feel like she has gotten completely well at any point during this time. Here in the ED tonight she was diagnosed with lingular pneumonia as well as influenza A. Hospitalists were called for admission.  PAST MEDICAL HISTORY:   Past Medical History:  Diagnosis Date  . Asthma   . Hypertension     PAST SURGICAL HISTORY:   Past Surgical History:  Procedure Laterality Date  . ESOPHAGOGASTRODUODENOSCOPY (EGD) WITH PROPOFOL N/A 05/13/2015   Procedure: ESOPHAGOGASTRODUODENOSCOPY (EGD) WITH PROPOFOL;  Surgeon: Lucilla Lame, MD;  Location: [facility] ENDOSCOPY;  Service: Endoscopy;  Laterality: N/A;    SOCIAL HISTORY:   Social History  Substance Use Topics  . Smoking status: Former Research scientist (life sciences)  . Smokeless tobacco: Never Used  . Alcohol use No    FAMILY HISTORY:   Family History  Problem Relation Age of Onset  . Diabetes Mother   . Diabetes Sister     DRUG ALLERGIES:   Allergies  Allergen Reactions  . Ketoconazole Swelling    Generalized body aches and swelling   . Statins Other (See Comments)    Pt thinks it causes weakness? Made her feel bad.     MEDICATIONS AT HOME:   Prior to Admission medications   Medication Sig Start Date End Date Taking? Authorizing Provider  acetaminophen (TYLENOL) 325 MG tablet Take 650 mg by mouth every 6 (six) hours as  needed for mild pain or headache.   Yes Historical Provider, MD  albuterol (PROVENTIL HFA;VENTOLIN HFA) 108 (90 BASE) MCG/ACT inhaler Inhale 4-6 puffs by mouth every 4 hours as needed for wheezing, cough, and/or shortness of breath 06/24/14  Yes Hinda Kehr, MD  amLODipine (NORVASC) 5 MG tablet Take 5 mg by mouth daily.   Yes Historical Provider, MD  aspirin EC 81 MG tablet Take 81 mg by mouth daily.   Yes Historical Provider, MD  doxycycline (VIBRA-TABS) 100 MG tablet Take 100 mg by mouth 2 (two) times daily.   Yes Historical Provider, MD  enalapril (VASOTEC) 20 MG tablet Take 20 mg by mouth daily.   Yes Historical Provider, MD  furosemide (LASIX) 40 MG tablet Take 40 mg by mouth daily.    Yes Historical Provider, MD  gabapentin (NEURONTIN) 300 MG capsule Take 900 mg by mouth every evening.   Yes Historical Provider, MD  guaiFENesin (MUCINEX) 600 MG 12 hr tablet Take 600 mg by mouth 2 (two) times daily.   Yes Historical Provider, MD  ipratropium-albuterol (DUONEB) 0.5-2.5 (3) MG/3ML SOLN Take 3 mLs by nebulization.   Yes Historical Provider, MD  levothyroxine (SYNTHROID, LEVOTHROID) 125 MCG tablet Take 125 mcg by mouth daily before breakfast.   Yes Historical Provider, MD  Melatonin 5 MG TABS Take 5 mg by mouth at bedtime.   Yes Historical Provider, MD  metolazone (ZAROXOLYN) 2.5 MG tablet Take 2.5 mg by  mouth daily. For 3 days   Yes Historical Provider, MD  naphazoline-pheniramine (NAPHCON-A) 0.025-0.3 % ophthalmic solution Place 1 drop into both eyes 4 (four) times daily as needed for irritation.   Yes Historical Provider, MD  PARoxetine (PAXIL) 40 MG tablet Take 40 mg by mouth daily.   Yes Historical Provider, MD  senna (SENOKOT) 8.6 MG TABS tablet Take 1 tablet by mouth 2 (two) times daily.   Yes Historical Provider, MD  Cyanocobalamin 1000 MCG/ML KIT Inject 1,000 mcg/mL as directed every 30 (thirty) days.    Historical Provider, MD  torsemide (DEMADEX) 20 MG tablet Take 20 mg by mouth 2 (two)  times daily.    Historical Provider, MD    REVIEW OF SYSTEMS:  Review of Systems  Constitutional: Positive for fever and malaise/fatigue. Negative for chills and weight loss.  HENT: Negative for ear pain, hearing loss and tinnitus.   Eyes: Negative for blurred vision, double vision, pain and redness.  Respiratory: Positive for cough and shortness of breath. Negative for hemoptysis.   Cardiovascular: Negative for chest pain, palpitations, orthopnea and leg swelling.  Gastrointestinal: Negative for abdominal pain, constipation, diarrhea, nausea and vomiting.  Genitourinary: Negative for dysuria, frequency and hematuria.  Musculoskeletal: Negative for back pain, joint pain and neck pain.  Skin:       No acne, rash, or lesions  Neurological: Negative for dizziness, tremors, focal weakness and weakness.  Endo/Heme/Allergies: Negative for polydipsia. Does not bruise/bleed easily.  Psychiatric/Behavioral: Negative for depression. The patient is not nervous/anxious and does not have insomnia.      VITAL SIGNS:   Vitals:   03/14/16 2200 03/14/16 2230 03/14/16 2300 03/14/16 2320  BP: (!) 142/82  92/67   Pulse: 77 83 75   Resp: (!) 22 17 (!) 22   Temp: 100.2 F (37.9 C)   (S) 99.5 F (37.5 C)  TempSrc: Oral   Oral  SpO2: 94% 100% 95%   Weight:      Height:       Wt Readings from Last 3 Encounters:  03/14/16 98.9 kg (218 lb)  05/12/15 102.2 kg (225 lb 3.2 oz)  06/24/14 104.3 kg (230 lb)    PHYSICAL EXAMINATION:  Physical Exam  Vitals reviewed. Constitutional: She is oriented to person, place, and time. She appears well-developed and well-nourished. No distress.  HENT:  Head: Normocephalic and atraumatic.  Mouth/Throat: Oropharynx is clear and moist.  Eyes: Conjunctivae and EOM are normal. Pupils are equal, round, and reactive to light. No scleral icterus.  Neck: Normal range of motion. Neck supple. No JVD present. No thyromegaly present.  Cardiovascular: Normal rate, regular  rhythm and intact distal pulses.  Exam reveals no gallop and no friction rub.   No murmur heard. Respiratory: Effort normal. No respiratory distress. She has no wheezes. She has no rales.  Coarse central breath sounds  GI: Soft. Bowel sounds are normal. She exhibits no distension. There is no tenderness.  Musculoskeletal: Normal range of motion. She exhibits no edema.  No arthritis, no gout  Lymphadenopathy:    She has no cervical adenopathy.  Neurological: She is alert and oriented to person, place, and time. No cranial nerve deficit.  No dysarthria, no aphasia  Skin: Skin is warm and dry. No rash noted. No erythema.  Psychiatric: She has a normal mood and affect. Her behavior is normal. Judgment and thought content normal.    LABORATORY PANEL:   CBC  Recent Labs Lab 03/14/16 2051  WBC 5.9  HGB 12.3  HCT 35.6  PLT 198   ------------------------------------------------------------------------------------------------------------------  Chemistries   Recent Labs Lab 03/14/16 2051  NA 135  K 3.6  CL 94*  CO2 31  GLUCOSE 166*  BUN 18  CREATININE 1.25*  CALCIUM 8.9   ------------------------------------------------------------------------------------------------------------------  Cardiac Enzymes  Recent Labs Lab 03/14/16 2051  TROPONINI 0.03*   ------------------------------------------------------------------------------------------------------------------  RADIOLOGY:  Dg Chest 2 View  Result Date: 03/14/2016 CLINICAL DATA:  83 y/o  F; shortness of breath. EXAM: CHEST  2 VIEW COMPARISON:  06/24/2014 chest radiograph. FINDINGS: Obscuration of left heart border, probable lingular opacity. Stable cardiac silhouette given projection and technique. Aortic atherosclerosis with calcification. Left upper quadrant surgical clips. Bones are demineralized. No acute osseous abnormality identified. IMPRESSION: Opacity within the lingula suspicious for pneumonia. These results  will be called to the ordering clinician or representative by the Radiologist Assistant, and communication documented in the PACS or zVision Dashboard. Electronically Signed   By: Kristine Garbe M.D.   On: 03/14/2016 21:37    EKG:   Orders placed or performed during the hospital encounter of 03/14/16  . ED EKG  . ED EKG  . EKG 12-Lead  . EKG 12-Lead    IMPRESSION AND PLAN:  Principal Problem:   HCAP (healthcare-associated pneumonia) - patient resides at an assisted living facility, and has had recurrent outpatient pneumonia. We'll treat her here with escalation of to broad-spectrum antibiotics, and order sputum culture. When necessary antitussive Active Problems:   Influenza A - Tamiflu   HTN (hypertension) - home meds   Asthma - home meds plus when necessary DuoNeb's  All the records are reviewed and case discussed with ED provider. Management plans discussed with the patient and/or family.  DVT PROPHYLAXIS: SubQ heparin  GI PROPHYLAXIS: None  ADMISSION STATUS: Inpatient  CODE STATUS: Full Code Status History    Date Active Date Inactive Code Status Order ID Comments User Context   05/12/2015  7:48 PM 05/14/2015  3:07 PM Full Code [id number]  Nicholes Mango, MD Inpatient    Advance Directive Documentation   Flowsheet Row Most Recent Value  Type of Advance Directive  Healthcare Power of Attorney  Pre-existing out of facility DNR order (yellow form or pink MOST form)  No data  "MOST" Form in Place?  No data      TOTAL TIME TAKING CARE OF THIS PATIENT: 45 minutes.    Anberlyn Feimster FIELDING 03/14/2016, 11:26 PM  [facility]  Office  [phone]  CC: Primary care physician; Jeraldine Loots, MD

## 2016-03-15 LAB — CBC
HCT: 33.5 % — ABNORMAL LOW (ref 35.0–47.0)
Hemoglobin: 11.2 g/dL — ABNORMAL LOW (ref 12.0–16.0)
MCH: 28.1 pg (ref 26.0–34.0)
MCHC: 33.4 g/dL (ref 32.0–36.0)
MCV: 84.3 fL (ref 80.0–100.0)
PLATELETS: 177 10*3/uL (ref 150–440)
RBC: 3.97 MIL/uL (ref 3.80–5.20)
RDW: 14.3 % (ref 11.5–14.5)
WBC: 4.5 10*3/uL (ref 3.6–11.0)

## 2016-03-15 LAB — BASIC METABOLIC PANEL
Anion gap: 9 (ref 5–15)
BUN: 20 mg/dL (ref 6–20)
CALCIUM: 8.4 mg/dL — AB (ref 8.9–10.3)
CO2: 30 mmol/L (ref 22–32)
Chloride: 95 mmol/L — ABNORMAL LOW (ref 101–111)
Creatinine, Ser: 1.32 mg/dL — ABNORMAL HIGH (ref 0.44–1.00)
GFR, EST AFRICAN AMERICAN: 43 mL/min — AB (ref >60)
GFR, EST NON AFRICAN AMERICAN: 37 mL/min — AB (ref >60)
Glucose, Bld: 98 mg/dL (ref 65–99)
Potassium: 3.4 mmol/L — ABNORMAL LOW (ref 3.5–5.1)
SODIUM: 134 mmol/L — AB (ref 135–145)

## 2016-03-15 LAB — MAGNESIUM: MAGNESIUM: 1.4 mg/dL — AB (ref 1.7–2.4)

## 2016-03-15 LAB — EXPECTORATED SPUTUM ASSESSMENT W REFEX TO RESP CULTURE

## 2016-03-15 LAB — MRSA PCR SCREENING: MRSA BY PCR: NEGATIVE

## 2016-03-15 LAB — TSH: TSH: 0.591 u[IU]/mL (ref 0.350–4.500)

## 2016-03-15 MED ORDER — VANCOMYCIN HCL IN DEXTROSE 1-5 GM/200ML-% IV SOLN
1000.0000 mg | INTRAVENOUS | Status: DC
  Administered 2016-03-15: 1000 mg via INTRAVENOUS
  Filled 2016-03-15 (×2): qty 200

## 2016-03-15 MED ORDER — ACETAMINOPHEN 650 MG RE SUPP: 650.0000 mg | Freq: Four times a day (QID) | RECTAL | Status: DC | PRN

## 2016-03-15 MED ORDER — SALINE SPRAY 0.65 % NA SOLN
1.0000 | NASAL | Status: DC | PRN
  Administered 2016-03-15: 1 via NASAL
  Filled 2016-03-15: qty 44

## 2016-03-15 MED ORDER — VANCOMYCIN HCL IN DEXTROSE 1-5 GM/200ML-% IV SOLN
1000.0000 mg | Freq: Once | INTRAVENOUS | Status: AC
  Administered 2016-03-15: 1000 mg via INTRAVENOUS
  Filled 2016-03-15: qty 200

## 2016-03-15 MED ORDER — ONDANSETRON HCL 4 MG/2ML IJ SOLN: 4.0000 mg | Freq: Four times a day (QID) | INTRAMUSCULAR | Status: DC | PRN

## 2016-03-15 MED ORDER — PAROXETINE HCL 20 MG PO TABS
40.0000 mg | ORAL_TABLET | Freq: Every day | ORAL | Status: DC
  Administered 2016-03-15 – 2016-03-16 (×2): 40 mg via ORAL
  Filled 2016-03-15 (×2): qty 2

## 2016-03-15 MED ORDER — POTASSIUM CHLORIDE CRYS ER 20 MEQ PO TBCR
40.0000 meq | EXTENDED_RELEASE_TABLET | Freq: Once | ORAL | Status: AC
  Administered 2016-03-15: 40 meq via ORAL
  Filled 2016-03-15: qty 2

## 2016-03-15 MED ORDER — GUAIFENESIN-DM 100-10 MG/5ML PO SYRP
5.0000 mL | ORAL_SOLUTION | ORAL | Status: DC | PRN
  Administered 2016-03-15 – 2016-03-16 (×3): 5 mL via ORAL
  Filled 2016-03-15 (×3): qty 5

## 2016-03-15 MED ORDER — IPRATROPIUM-ALBUTEROL 0.5-2.5 (3) MG/3ML IN SOLN
3.0000 mL | RESPIRATORY_TRACT | Status: DC | PRN
  Administered 2016-03-15: 3 mL via RESPIRATORY_TRACT
  Filled 2016-03-15: qty 3

## 2016-03-15 MED ORDER — ACETAMINOPHEN 325 MG PO TABS: 650.0000 mg | ORAL_TABLET | Freq: Four times a day (QID) | ORAL | Status: DC | PRN

## 2016-03-15 MED ORDER — OSELTAMIVIR PHOSPHATE 30 MG PO CAPS: 30.0000 mg | ORAL_CAPSULE | Freq: Every day | ORAL | Status: DC

## 2016-03-15 MED ORDER — DEXTROMETHORPHAN POLISTIREX ER 30 MG/5ML PO SUER
30.0000 mg | Freq: Two times a day (BID) | ORAL | Status: DC
  Administered 2016-03-15 – 2016-03-16 (×2): 30 mg via ORAL
  Filled 2016-03-15 (×3): qty 5

## 2016-03-15 MED ORDER — GABAPENTIN 300 MG PO CAPS
900.0000 mg | ORAL_CAPSULE | Freq: Every evening | ORAL | Status: DC
  Administered 2016-03-15: 900 mg via ORAL
  Filled 2016-03-15: qty 3

## 2016-03-15 MED ORDER — DEXTROSE 5 % IV SOLN: 2.0000 g | Freq: Two times a day (BID) | INTRAVENOUS | Status: DC

## 2016-03-15 MED ORDER — ENOXAPARIN SODIUM 40 MG/0.4ML ~~LOC~~ SOLN
40.0000 mg | SUBCUTANEOUS | Status: DC
  Administered 2016-03-15: 40 mg via SUBCUTANEOUS
  Filled 2016-03-15: qty 0.4

## 2016-03-15 MED ORDER — HEPARIN SODIUM (PORCINE) 5000 UNIT/ML IJ SOLN
5000.0000 [IU] | Freq: Three times a day (TID) | INTRAMUSCULAR | Status: DC
  Administered 2016-03-15: 5000 [IU] via SUBCUTANEOUS
  Filled 2016-03-15: qty 1

## 2016-03-15 MED ORDER — DM-GUAIFENESIN ER 30-600 MG PO TB12: 1.0000 | ORAL_TABLET | Freq: Two times a day (BID) | ORAL | Status: DC

## 2016-03-15 MED ORDER — GUAIFENESIN ER 600 MG PO TB12
600.0000 mg | ORAL_TABLET | Freq: Two times a day (BID) | ORAL | Status: DC
  Administered 2016-03-15 – 2016-03-16 (×2): 600 mg via ORAL
  Filled 2016-03-15 (×2): qty 1

## 2016-03-15 MED ORDER — LEVOTHYROXINE SODIUM 50 MCG PO TABS
125.0000 ug | ORAL_TABLET | Freq: Every day | ORAL | Status: DC
  Administered 2016-03-15 – 2016-03-16 (×2): 125 ug via ORAL
  Filled 2016-03-15: qty 1
  Filled 2016-03-15: qty 3

## 2016-03-15 MED ORDER — MAGNESIUM SULFATE 4 GM/100ML IV SOLN
4.0000 g | Freq: Once | INTRAVENOUS | Status: AC
  Administered 2016-03-15: 4 g via INTRAVENOUS
  Filled 2016-03-15: qty 100

## 2016-03-15 MED ORDER — OSELTAMIVIR PHOSPHATE 30 MG PO CAPS
30.0000 mg | ORAL_CAPSULE | Freq: Two times a day (BID) | ORAL | Status: DC
  Administered 2016-03-15 – 2016-03-16 (×3): 30 mg via ORAL
  Filled 2016-03-15 (×3): qty 1

## 2016-03-15 MED ORDER — CEFEPIME HCL 2 G IV SOLR
2.0000 g | Freq: Two times a day (BID) | INTRAVENOUS | Status: DC
  Administered 2016-03-15 (×2): 2 g via INTRAVENOUS
  Filled 2016-03-15 (×5): qty 2

## 2016-03-15 MED ORDER — AMLODIPINE BESYLATE 5 MG PO TABS
5.0000 mg | ORAL_TABLET | Freq: Every day | ORAL | Status: DC
  Administered 2016-03-15 – 2016-03-16 (×2): 5 mg via ORAL
  Filled 2016-03-15 (×2): qty 1

## 2016-03-15 MED ORDER — ASPIRIN EC 81 MG PO TBEC
81.0000 mg | DELAYED_RELEASE_TABLET | Freq: Every day | ORAL | Status: DC
  Administered 2016-03-15 – 2016-03-16 (×2): 81 mg via ORAL
  Filled 2016-03-15 (×2): qty 1

## 2016-03-15 MED ORDER — SENNOSIDES-DOCUSATE SODIUM 8.6-50 MG PO TABS
1.0000 | ORAL_TABLET | Freq: Two times a day (BID) | ORAL | Status: DC | PRN
  Administered 2016-03-15: 1 via ORAL
  Filled 2016-03-15: qty 1

## 2016-03-15 MED ORDER — ONDANSETRON HCL 4 MG PO TABS: 4.0000 mg | ORAL_TABLET | Freq: Four times a day (QID) | ORAL | Status: DC | PRN

## 2016-03-15 NOTE — Progress Notes (Signed)
PT Cancellation Note  Patient Details Name: Monica Warren MRN: [id number] DOB: 04/19/1932   Cancelled Treatment:    Reason Eval/Treat Not Completed: Patient declined, no reason specified.  Pt reports just having a bad coughing spell and declining any PT (pt stating she needs "to be able to breathe first" before participating in PT); nursing present and aware.  No PT eval able to be initiated.  Will re-attempt PT eval at a later date/time.  Hendricks LimesEmily Tevita Gomer, PT 03/15/16, 3:22 PM [phone]

## 2016-03-15 NOTE — [place] FL2 (Signed)
[place] MEDICAID FL2 LEVEL OF CARE SCREENING TOOL     IDENTIFICATION  Patient Name: Monica Warren Birthdate: 09/05/1932 Sex: female Admission Date (Current Location): 03/14/2016  [place]   Facility and Address:  [facility], [address], [place], [place] [address]      Provider Number: [id number]  Attending Physician Name and Address:  Shaune PollackQing Rafeef Lau, MD  Relative Name and Phone Number:       Current Level of Care: Hospital Recommended Level of Care: Skilled Nursing Facility Prior Approval Number:    Date Approved/Denied:   PASRR Number:  [id number])  Discharge Plan: SNF    Current Diagnoses: Patient Active Problem List   Diagnosis Date Noted  . Influenza A 03/14/2016  . HCAP (healthcare-associated pneumonia) 03/14/2016  . HTN (hypertension) 03/14/2016  . Asthma 03/14/2016  . Acute GI bleeding   . Melena 05/12/2015    Orientation RESPIRATION BLADDER Height & Weight     Self, Time, Situation, Place  O2 (Nasal Cannula 2L/min) Incontinent Weight: 223 lb (101.2 kg) Height:  5\' 6"  (167.6 cm)  BEHAVIORAL SYMPTOMS/MOOD NEUROLOGICAL BOWEL NUTRITION STATUS   (None. )  (None. ) Continent Diet (Diet: Heart Room)  AMBULATORY STATUS COMMUNICATION OF NEEDS Skin   Limited Assist Verbally Normal                       Personal Care Assistance Level of Assistance  Bathing, Feeding, Dressing Bathing Assistance: Limited assistance Feeding assistance: Independent Dressing Assistance: Limited assistance     Functional Limitations Info  Sight, Hearing, Speech Sight Info: Impaired Hearing Info: Adequate Speech Info: Impaired    SPECIAL CARE FACTORS FREQUENCY   Patient is flu positive.                     Contractures      Additional Factors Info  Code Status, Allergies Code Status Info:  (Full Code) Allergies Info:  (Ketoconazole, Statins)           Current Medications (03/15/2016):   This is the current hospital active medication list Current Facility-Administered Medications  Medication Dose Route Frequency Provider Last Rate Last Dose  . acetaminophen (TYLENOL) tablet 650 mg  650 mg Oral Q6H PRN Oralia Manisavid Willis, MD       Or  . acetaminophen (TYLENOL) suppository 650 mg  650 mg Rectal Q6H PRN Oralia Manisavid Willis, MD      . amLODipine (NORVASC) tablet 5 mg  5 mg Oral Daily Oralia Manisavid Willis, MD   5 mg at 03/15/16 0900  . aspirin EC tablet 81 mg  81 mg Oral Daily Oralia Manisavid Willis, MD   81 mg at 03/15/16 0900  . ceFEPIme (MAXIPIME) injection 2 g  2 g Intravenous Q12H Oralia Manisavid Willis, MD   2 g at 03/15/16 0317  . gabapentin (NEURONTIN) capsule 900 mg  900 mg Oral QPM Oralia Manisavid Willis, MD      . guaiFENesin-dextromethorphan [facility](ROBITUSSIN DM) 100-10 MG/5ML syrup 5 mL  5 mL Oral Q4H PRN Oralia Manisavid Willis, MD   5 mL at 03/15/16 0318  . heparin injection 5,000 Units  5,000 Units Subcutaneous Q8H Oralia Manisavid Willis, MD      . ipratropium-albuterol (DUONEB) 0.5-2.5 (3) MG/3ML nebulizer solution 3 mL  3 mL Nebulization Q4H PRN Oralia Manisavid Willis, MD      . levothyroxine (SYNTHROID, LEVOTHROID) tablet 125 mcg  125 mcg Oral QAC breakfast Oralia Manisavid Willis, MD   125 mcg at 03/15/16 0900  .  ondansetron (ZOFRAN) tablet 4 mg  4 mg Oral Q6H PRN Oralia Manis, MD       Or  . ondansetron [facility]) injection 4 mg  4 mg Intravenous Q6H PRN Oralia Manis, MD      . oseltamivir (TAMIFLU) capsule 30 mg  30 mg Oral BID Oralia Manis, MD   30 mg at 03/15/16 0900  . PARoxetine (PAXIL) tablet 40 mg  40 mg Oral Daily Oralia Manis, MD   40 mg at 03/15/16 0900  . vancomycin (VANCOCIN) IVPB 1000 mg/200 mL premix  1,000 mg Intravenous Q18H Oralia Manis, MD   1,000 mg at 03/15/16 0900     Discharge Medications: Please see discharge summary for a list of discharge medications.  Relevant Imaging Results:  Relevant Lab Results:   Additional Information  (SSN: [id number])  Ralene Bathe, Student-Social Work

## 2016-03-15 NOTE — Evaluation (Deleted)
Physical Therapy Evaluation Patient Details Name: Monica Warren MRN: [id number] DOB: 02/07/1932 Today's Date: 03/15/2016   History of Present Illness  Pt is an 83 y.o. female presenting to hospital with nausea and AMS.  Pt s/p lithotripsy for R sided kidney stone 1 day prior.  Pt admitted with UTI.  PMH includes a-fib, dementia, recurrent B inguinal hernia, DM.  Clinical Impression  Prior to hospital admission, pt was independent with functional mobility and active walking.  Pt lives with a friend and both help each other out as needed.  Currently pt is modified independent with bed mobility and CGA with transfers and ambulation around nursing loop without AD.  Pt still demonstrates some confusion and difficulty following multi-step commands but cognition appears to be improving per nursing and pt's friend's report.  Pt would benefit from skilled PT to address noted impairments and functional limitations during hospital stay.  Recommend pt discharge to home when medically appropriate with initial 24/7 supervision for safety (d/t pt still having some confusion).    Follow Up Recommendations No PT follow up;Supervision/Assistance - 24 hour    Equipment Recommendations  None recommended by PT    Recommendations for Other Services       Precautions / Restrictions Precautions Precautions: Fall Restrictions Weight Bearing Restrictions: No      Mobility  Bed Mobility Overal bed mobility: Modified Independent             General bed mobility comments: Supine to sit with HOB elevated  Transfers Overall transfer level: Needs assistance Equipment used: None Transfers: Sit to/from Stand Sit to Stand: Min guard         General transfer comment: strong stand; steady without loss of balance  Ambulation/Gait Ambulation/Gait assistance: Min guard Ambulation Distance (Feet): 240 Feet Assistive device: None   Gait velocity: mildly decreased   General Gait Details: mild  decreased step length but steady without loss of balance  Stairs            Wheelchair Mobility    Modified Rankin (Stroke Patients Only)       Balance Overall balance assessment: Needs assistance Sitting-balance support: No upper extremity supported;Feet supported Sitting balance-Leahy Scale: Normal     Standing balance support: No upper extremity supported;During functional activity Standing balance-Leahy Scale: Good Standing balance comment: with ambulation                             Pertinent Vitals/Pain Pain Assessment: No/denies pain  Vitals (HR and O2 on room air) stable and WFL throughout treatment session.    Home Living Family/patient expects to be discharged to:: Private residence Living Arrangements: Non-relatives/Friends (Female friend) Available Help at Discharge: Friend(s);Available 24 hours/day Type of Home: House Home Access: Stairs to enter Entrance Stairs-Rails: Right;Left;Can reach both Entrance Stairs-Number of Steps: 3 Home Layout: One level Home Equipment: None      Prior Function Level of Independence: Independent         Comments: Pt active with walking for exercise; denies any falls in past 6 months.     Hand Dominance        Extremity/Trunk Assessment   Upper Extremity Assessment Upper Extremity Assessment: Overall WFL for tasks assessed    Lower Extremity Assessment Lower Extremity Assessment: Overall WFL for tasks assessed    Cervical / Trunk Assessment Cervical / Trunk Assessment: Normal  Communication   Communication: No difficulties  Cognition Arousal/Alertness: Awake/alert Behavior During Therapy: [facility]  for tasks assessed/performed Overall Cognitive Status: Impaired/Different from baseline Area of Impairment:  (Oriented to person, place, situation.  Oriented to day but not month or year.)                    General Comments General comments (skin integrity, edema, etc.): Pt's female friend  present for session.  Nursing cleared pt for participation in physical therapy.  Pt agreeable to PT session.    Exercises Total Joint Exercises Ankle Circles/Pumps: AROM;Strengthening;Both;10 reps;Supine Quad Sets: AROM;Strengthening;Both;10 reps;Supine Short Arc Quad: AROM;Strengthening;Both;10 reps;Supine Heel Slides: AROM;Strengthening;Both;10 reps;Supine Straight Leg Raises: AROM;Strengthening;Both;10 reps;Supine  Pt requiring vc's and tactile cues for above exercise technique.   Assessment/Plan    PT Assessment Patient needs continued PT services  PT Problem List Decreased cognition;Decreased balance       PT Treatment Interventions Gait training;Stair training;Functional mobility training;Therapeutic activities;Therapeutic exercise;Balance training;Patient/family education    PT Goals (Current goals can be found in the Care Plan section)  Acute Rehab PT Goals Patient Stated Goal: to go home PT Goal Formulation: With patient Time For Goal Achievement: 03/29/16 Potential to Achieve Goals: Good    Frequency Min 2X/week   Barriers to discharge        Co-evaluation               End of Session Equipment Utilized During Treatment: Gait belt Activity Tolerance: Patient tolerated treatment well Patient left: in chair;with call bell/phone within reach;with chair alarm set;with family/visitor present Nurse Communication: Mobility status;Precautions PT Visit Diagnosis: Difficulty in walking, not elsewhere classified (R26.2)         Time: 1610-9604 PT Time Calculation (min) (ACUTE ONLY): 35 min   Charges:   PT Evaluation $PT Eval Low Complexity: 1 Procedure PT Treatments $Therapeutic Exercise: 23-37 mins   PT G CodesHendricks Limes, PT 03/15/16, 2:37 PM [phone]

## 2016-03-15 NOTE — Clinical Social Work Note (Addendum)
Clinical Social Work Assessment  Patient Details  Name: Monica Warren MRN: [id number] Date of Birth: 07-29-1932  Date of referral:  03/15/16               Reason for consult:  Facility Placement, Discharge Planning                Permission sought to share information with:  Chartered certified accountant granted to share information::  Yes, Verbal Permission Granted  Name::      Monica Warren::   [place]   Relationship::     Contact Information:     Housing/Transportation Living arrangements for the past 2 months:  [place] ([facility]) Source of Information:  Patient Patient Interpreter Needed:  None Criminal Activity/Legal Involvement Pertinent to Current Situation/Hospitalization:  No - Comment as needed Significant Relationships:  Adult Children Lives with:  Facility Resident [facility]) Do you feel safe going back to the place where you live?  Yes Need for family participation in patient care:  Yes (Comment)  Care giving concerns:  Patient is a long-term care resident at [facility].    Social Worker assessment / plan:  Holiday representative (CSW) noted that patient was from a facility. PT has not worked with patient at this time. Social work Theatre manager met with patient at bedside. Patient was sitting up in bed. Social work Theatre manager introduced herself and explained role of social work department. Per patient, she is from [facility] and has lived there since October 2017. Patient has three children that all live in the area. Patients son Herbie [place] is patient's HPOA. Patient is willing to go back to [facility] when medically stable for discharge. Neoma Laming, admissions coordinator at [facility] confirmed patient as a long-term resident and has only been there a few months. Neoma Laming stated that patient just received long-term medicaid. CSW made Neoma Laming aware that patient is positive for the  flu. Neoma Laming stated that patient can return to [facility] when patient is medically stable for discharge. Social work Theatre manager will continue to assist and follow as needed.   Employment status:  Unemployed Nurse, adult PT Recommendations:  Not assessed at this time Information / Referral to community resources:  [place]  Patient/Family's Response to care:  Patient is wanting to go back to [facility] when medically stable for discharge.   Patient/Family's Understanding of and Emotional Response to Diagnosis, Current Treatment, and Prognosis:  Patient was pleasant and thanked social work Theatre manager for visiting.   Emotional Assessment Appearance:  Appears stated age Attitude/Demeanor/Rapport:    Affect (typically observed):  Accepting, Adaptable, Appropriate Orientation:  Oriented to Self, Oriented to Place, Oriented to  Time, Oriented to Situation Alcohol / Substance use:  Not Applicable Psych involvement (Current and /or in the community):  No (Comment)  Discharge Needs  Concerns to be addressed:  Basic Needs Readmission within the last 30 days:  No Current discharge risk:  Chronically ill Barriers to Discharge:  Continued Medical Work up   [employer], [place] Work 03/15/2016, 10:01 AM

## 2016-03-15 NOTE — Progress Notes (Addendum)
[facility] [place] [facility]   PATIENT NAME: Monica Warren    MR#:  [id number]  DATE OF BIRTH:  04/11/1932  SUBJECTIVE:  CHIEF COMPLAINT:   Chief Complaint  Patient presents with  . Pneumonia   Better cough and shortness of breath, generalized weakness. REVIEW OF SYSTEMS:  Review of Systems  Constitutional: Positive for malaise/fatigue. Negative for chills and fever.  HENT: Negative for congestion, sinus pain and sore throat.   Eyes: Negative for blurred vision and double vision.  Respiratory: Positive for cough, sputum production and shortness of breath. Negative for hemoptysis and wheezing.   Cardiovascular: Negative for chest pain and leg swelling.  Gastrointestinal: Negative for abdominal pain, blood in stool, constipation, diarrhea, melena, nausea and vomiting.  Genitourinary: Negative for dysuria and hematuria.  Musculoskeletal: Negative for joint pain.  Neurological: Positive for weakness. Negative for dizziness, focal weakness and loss of consciousness.  Psychiatric/Behavioral: Negative for depression. The patient is not nervous/anxious.     DRUG ALLERGIES:   Allergies  Allergen Reactions  . Ketoconazole Swelling    Generalized body aches and swelling   . Statins Other (See Comments)    Pt thinks it causes weakness? Made her feel bad.    VITALS:  Blood pressure (!) 166/65, pulse 81, temperature 99.3 F (37.4 C), temperature source Oral, resp. rate 18, height 5\' 6"  (1.676 m), weight 223 lb (101.2 kg), SpO2 98 %. PHYSICAL EXAMINATION:  Physical Exam  Constitutional: She is oriented to person, place, and time and well-developed, well-nourished, and in no distress.  Obese.  HENT:  Head: Normocephalic.  Mouth/Throat: Oropharynx is clear and moist.  Eyes: Conjunctivae are normal.  Neck: Normal range of motion. Neck supple. No JVD present. No tracheal deviation present.  Cardiovascular: Normal rate, regular rhythm and normal heart  sounds.  Exam reveals no gallop.   No murmur heard. Pulmonary/Chest: Effort normal. No respiratory distress. She has no wheezes. She has no rales.  Crackles bilaterally.  Abdominal: Soft. Bowel sounds are normal. She exhibits no distension. There is no tenderness.  Musculoskeletal: Normal range of motion. She exhibits no edema.  Neurological: She is alert and oriented to person, place, and time. No cranial nerve deficit.  Skin: No erythema.  Psychiatric: Affect normal.   LABORATORY PANEL:  Female CBC  Recent Labs Lab 03/15/16 0441  WBC 4.5  HGB 11.2*  HCT 33.5*  PLT 177   ------------------------------------------------------------------------------------------------------------------ Chemistries   Recent Labs Lab 03/15/16 0441  NA 134*  K 3.4*  CL 95*  CO2 30  GLUCOSE 98  BUN 20  CREATININE 1.32*  CALCIUM 8.4*  MG 1.4*   RADIOLOGY:  Dg Chest 2 View  Result Date: 03/14/2016 CLINICAL DATA:  83 y/o  F; shortness of breath. EXAM: CHEST  2 VIEW COMPARISON:  06/24/2014 chest radiograph. FINDINGS: Obscuration of left heart border, probable lingular opacity. Stable cardiac silhouette given projection and technique. Aortic atherosclerosis with calcification. Left upper quadrant surgical clips. Bones are demineralized. No acute osseous abnormality identified. IMPRESSION: Opacity within the lingula suspicious for pneumonia. These results will be called to the ordering clinician or representative by the Radiologist Assistant, and communication documented in the PACS or zVision Dashboard. Electronically Signed   By: Mitzi HansenLance  Furusawa-Stratton M.D.   On: 03/14/2016 21:37   ASSESSMENT AND PLAN:   HCAP (healthcare-associated pneumonia) - patient resides at an assisted living facility, and has had recurrent outpatient pneumonia. Continue Cefepime but discontinue vancomycin (MRSA negative), and  f/u sputum culture. When  necessary antitussive    Influenza A - Tamiflu   HTN (hypertension) -  home meds   Asthma - home meds plus when necessary DuoNeb's  Hypomagnesemia and hypokalemia. Give supplement and follow-up level. Acute renal failure. IV fluid support and follow-up BMP.  All the records are reviewed and case discussed with Care Management/Social Worker. Management plans discussed with the patient, her daughter and they are in agreement.  CODE STATUS: Full Code  TOTAL TIME TAKING CARE OF THIS PATIENT: 35 minutes.   More than 50% of the time was spent in counseling/coordination of care: YES  POSSIBLE D/C IN 2 DAYS, DEPENDING ON CLINICAL CONDITION.   Shaune Pollack M.D on 03/15/2016 at 4:55 PM  Between 7am to 6pm - Pager - [phone]  After 6pm go to [email or web address] - Social research officer, government  [facility] [place] [facility]  Office  [phone]  CC: Primary care physician; Donell Sievert, MD  Note: This dictation was prepared with Dragon dictation along with smaller phrase technology. Any transcriptional errors that result from this process are unintentional.

## 2016-03-16 LAB — MAGNESIUM: Magnesium: 2.3 mg/dL (ref 1.7–2.4)

## 2016-03-16 LAB — BASIC METABOLIC PANEL
ANION GAP: 6 (ref 5–15)
BUN: 19 mg/dL (ref 6–20)
CALCIUM: 8.6 mg/dL — AB (ref 8.9–10.3)
CO2: 32 mmol/L (ref 22–32)
Chloride: 97 mmol/L — ABNORMAL LOW (ref 101–111)
Creatinine, Ser: 1.07 mg/dL — ABNORMAL HIGH (ref 0.44–1.00)
GFR, EST AFRICAN AMERICAN: 55 mL/min — AB (ref >60)
GFR, EST NON AFRICAN AMERICAN: 47 mL/min — AB (ref >60)
GLUCOSE: 101 mg/dL — AB (ref 65–99)
Potassium: 3.8 mmol/L (ref 3.5–5.1)
Sodium: 135 mmol/L (ref 135–145)

## 2016-03-16 MED ORDER — OSELTAMIVIR PHOSPHATE 30 MG PO CAPS: 30.0000 mg | ORAL_CAPSULE | Freq: Two times a day (BID) | ORAL | 0 refills | Status: AC

## 2016-03-16 MED ORDER — LEVOFLOXACIN 500 MG PO TABS: 500.0000 mg | ORAL_TABLET | Freq: Every day | ORAL | 0 refills | Status: AC

## 2016-03-16 MED ORDER — GUAIFENESIN ER 600 MG PO TB12: 600.0000 mg | ORAL_TABLET | Freq: Two times a day (BID) | ORAL | 0 refills | Status: AC

## 2016-03-16 NOTE — Progress Notes (Signed)
Patient is medically stable for D[facility]C [facility] today. Per Gavin Poundeborah admissions coordinator at [facility] patient will go to room 217-B. RN will call report to [facility] and arrange EMS for transport. Clinical Child psychotherapistocial Worker (CSW) sent [facility] orders [facility] via [employer]. Patient is aware of above. CSW contacted patient's daughter Angelique BlonderDenise and made her aware of above. Please reconsult if future social work needs arise. CSW signing off.   Baker Hughes IncorporatedBailey Tommie Bohlken, LCSW [phone]

## 2016-03-16 NOTE — Discharge Instructions (Signed)
Heart healthy diet. °Fall precaution. °

## 2016-03-16 NOTE — Progress Notes (Signed)
Patient left by EMS. No IV in place. O2 at 2L. Purse and glasses sent with patient, along with books and house coat.

## 2016-03-16 NOTE — Care Management Important Message (Signed)
Important Message  Patient Details  Name: Monica Warren MRN: [id number] Date of Birth: 11/11/1932   Medicare Important Message Given:  Yes    Marily MemosLisa M Shakoya Gilmore, RN 03/16/2016, 11:07 AM

## 2016-03-16 NOTE — Progress Notes (Signed)
Report called [facility]. Pt will return to original room with o2 at 2l.  Discussed discharge with patient and her daughter.

## 2016-03-16 NOTE — Discharge Summary (Addendum)
[facility] NAME: Monica Warren    MR#:  [id number]  DATE OF BIRTH:  11-15-1941  DATE OF ADMISSION:  03/14/2016   ADMITTING PHYSICIAN: Lance Coon, MD  DATE OF DISCHARGE: 03/16/2016 PRIMARY CARE PHYSICIAN: Jeraldine Loots, MD   ADMISSION DIAGNOSIS:  Hypoxia [R09.02] Lingular pneumonia [J18.9] Sepsis, due to unspecified organism ([place]) [A41.9] DISCHARGE DIAGNOSIS:  Principal Problem:   HCAP (healthcare-associated pneumonia) Active Problems:   Influenza A   HTN (hypertension)   Asthma  SECONDARY DIAGNOSIS:   Past Medical History:  Diagnosis Date  . Asthma   . Hypertension    HOSPITAL COURSE:   HCAP (healthcare-associated pneumonia) - patient resides at an assisted living facility, and has had recurrent outpatient pneumonia. Treated with Cefepime but discontinued vancomycin (MRSA negative), and  f/u sputum culture. When necessary antitussive Change to levaquin po for 5 more days.  Influenza A - continue Tamiflu for 3 more days. HTN (hypertension) - home meds Asthma - home meds plus when necessary DuoNeb's  Hypomagnesemia and hypokalemia. Give supplement and follow-up level. Acute renal failure. Improved with IV fluid support.  DISCHARGE CONDITIONS:  Stable, discharge to SNF today. CONSULTS OBTAINED:   DRUG ALLERGIES:   Allergies  Allergen Reactions  . Ketoconazole Swelling    Generalized body aches and swelling   . Statins Other (See Comments)    Pt thinks it causes weakness? Made her feel bad.    DISCHARGE MEDICATIONS:   Allergies as of 03/16/2016      Reactions   Ketoconazole Swelling   Generalized body aches and swelling    Statins Other (See Comments)   Pt thinks it causes weakness? Made her feel bad.       Medication List    STOP taking these medications   doxycycline 100 MG tablet Commonly known as:  VIBRA-TABS   metolazone 2.5 MG tablet Commonly known as:  ZAROXOLYN     torsemide 20 MG tablet Commonly known as:  DEMADEX     TAKE these medications   acetaminophen 325 MG tablet Commonly known as:  TYLENOL Take 650 mg by mouth every 6 (six) hours as needed for mild pain or headache.   albuterol 108 (90 Base) MCG/ACT inhaler Commonly known as:  PROVENTIL HFA;VENTOLIN HFA Inhale 4-6 puffs by mouth every 4 hours as needed for wheezing, cough, and/or shortness of breath   amLODipine 5 MG tablet Commonly known as:  NORVASC Take 5 mg by mouth daily.   aspirin EC 81 MG tablet Take 81 mg by mouth daily.   Cyanocobalamin 1000 MCG/ML Kit Inject 1,000 mcg/mL as directed every 30 (thirty) days.   enalapril 20 MG tablet Commonly known as:  VASOTEC Take 20 mg by mouth daily.   furosemide 40 MG tablet Commonly known as:  LASIX Take 40 mg by mouth daily.   gabapentin 300 MG capsule Commonly known as:  NEURONTIN Take 900 mg by mouth every evening.   guaiFENesin 600 MG 12 hr tablet Commonly known as:  MUCINEX Take 1 tablet (600 mg total) by mouth 2 (two) times daily.   ipratropium-albuterol 0.5-2.5 (3) MG/3ML Soln Commonly known as:  DUONEB Take 3 mLs by nebulization.   levofloxacin 500 MG tablet Commonly known as:  LEVAQUIN Take 1 tablet (500 mg total) by mouth daily.   levothyroxine 125 MCG tablet Commonly known as:  SYNTHROID, LEVOTHROID Take 125 mcg by mouth daily before breakfast.   Melatonin 5 MG Tabs Take 5 mg by mouth  at bedtime.   naphazoline-pheniramine 0.025-0.3 % ophthalmic solution Commonly known as:  NAPHCON-A Place 1 drop into both eyes 4 (four) times daily as needed for irritation.   oseltamivir 30 MG capsule Commonly known as:  TAMIFLU Take 1 capsule (30 mg total) by mouth 2 (two) times daily.   PARoxetine 40 MG tablet Commonly known as:  PAXIL Take 40 mg by mouth daily.   senna 8.6 MG Tabs tablet Commonly known as:  SENOKOT Take 1 tablet by mouth 2 (two) times daily.        DISCHARGE INSTRUCTIONS:  See  AVS. If you experience worsening of your admission symptoms, develop shortness of breath, life threatening emergency, suicidal or homicidal thoughts you must seek medical attention immediately by calling 911 or calling your MD immediately  if symptoms less severe.  You Must read complete instructions/literature along with all the possible adverse reactions/side effects for all the Medicines you take and that have been prescribed to you. Take any new Medicines after you have completely understood and accpet all the possible adverse reactions/side effects.   Please note  You were cared for by a hospitalist during your hospital stay. If you have any questions about your discharge medications or the care you received while you were in the hospital after you are discharged, you can call the unit and asked to speak with the hospitalist on call if the hospitalist that took care of you is not available. Once you are discharged, your primary care physician will handle any further medical issues. Please note that NO REFILLS for any discharge medications will be authorized once you are discharged, as it is imperative that you return to your primary care physician (or establish a relationship with a primary care physician if you do not have one) for your aftercare needs so that they can reassess your need for medications and monitor your lab values.    On the day of Discharge:  VITAL SIGNS:  Blood pressure (!) 139/53, pulse 62, temperature 98.3 F (36.8 C), temperature source Oral, resp. rate 18, height 5' 6"  (1.676 m), weight 223 lb (101.2 kg), SpO2 94 %. PHYSICAL EXAMINATION:  GENERAL:  83 y.o.-year-old patient lying in the bed with no acute distress. Obese. EYES: Pupils equal, round, reactive to light and accommodation. No scleral icterus. Extraocular muscles intact.  HEENT: Head atraumatic, normocephalic. Oropharynx and nasopharynx clear.  NECK:  Supple, no jugular venous distention. No thyroid  enlargement, no tenderness.  LUNGS: Normal breath sounds bilaterally, no wheezing, rales,rhonchi or crepitation. No use of accessory muscles of respiration.  CARDIOVASCULAR: S1, S2 normal. No murmurs, rubs, or gallops.  ABDOMEN: Soft, non-tender, non-distended. Bowel sounds present. No organomegaly or mass.  EXTREMITIES: No pedal edema, cyanosis, or clubbing.  NEUROLOGIC: Cranial nerves II through XII are intact. Muscle strength 4/5 in all extremities. Sensation intact. Gait not checked.  PSYCHIATRIC: The patient is alert and oriented x 3.  SKIN: No obvious rash, lesion, or ulcer.  DATA REVIEW:   CBC  Recent Labs Lab 03/15/16 0441  WBC 4.5  HGB 11.2*  HCT 33.5*  PLT 177    Chemistries   Recent Labs Lab 03/16/16 0439  NA 135  K 3.8  CL 97*  CO2 32  GLUCOSE 101*  BUN 19  CREATININE 1.07*  CALCIUM 8.6*  MG 2.3     Microbiology Results  Results for orders placed or performed during the hospital encounter of 03/14/16  Culture, blood (routine x 2)     Status: None (  Preliminary result)   Collection Time: 03/14/16  9:00 PM  Result Value Ref Range Status   Specimen Description BLOOD LEFT ANTECUBITAL  Final   Special Requests BOTTLES DRAWN AEROBIC AND ANAEROBIC  BCAV  Final   Culture NO GROWTH 2 DAYS  Final   Report Status PENDING  Incomplete  Culture, blood (routine x 2)     Status: None (Preliminary result)   Collection Time: 03/14/16  9:49 PM  Result Value Ref Range Status   Specimen Description BLOOD LEFT ANTECUBITAL  Final   Special Requests BOTTLES DRAWN AEROBIC AND ANAEROBIC  BCAV  Final   Culture NO GROWTH 2 DAYS  Final   Report Status PENDING  Incomplete  MRSA PCR Screening     Status: None   Collection Time: 03/15/16  1:07 AM  Result Value Ref Range Status   MRSA by PCR NEGATIVE NEGATIVE Final    Comment:        The GeneXpert MRSA Assay (FDA approved for NASAL specimens only), is one component of a comprehensive MRSA colonization surveillance program. It  is not intended to diagnose MRSA infection nor to guide or monitor treatment for MRSA infections.   Culture, expectorated sputum-assessment     Status: None   Collection Time: 03/15/16  3:16 PM  Result Value Ref Range Status   Specimen Description SPUTUM  Final   Special Requests NONE  Final   Sputum evaluation THIS SPECIMEN IS ACCEPTABLE FOR SPUTUM CULTURE  Final   Report Status 03/15/2016 FINAL  Final  Culture, respiratory (NON-Expectorated)     Status: None (Preliminary result)   Collection Time: 03/15/16  3:16 PM  Result Value Ref Range Status   Specimen Description SPUTUM  Final   Special Requests NONE Reflexed from [id number]  Final   Gram Stain   Final    ABUNDANT WBC PRESENT, PREDOMINANTLY MONONUCLEAR RARE GRAM POSITIVE COCCI IN PAIRS    Culture   Final    CULTURE REINCUBATED FOR BETTER GROWTH Performed at [facility], [address], [place], [place] [address]    Report Status PENDING  Incomplete    RADIOLOGY:  No results found.   Management plans discussed with the patient, family and they are in agreement.  CODE STATUS: DNR   TOTAL TIME TAKING CARE OF THIS PATIENT: 33 minutes.    Demetrios Loll M.D on 03/16/2016 at 10:31 AM  Between 7am to 6pm - Pager - [phone]  After 6pm go to [email or web address] - Proofreader  [facility]  Office  [phone]  CC: Primary care physician; Jeraldine Loots, MD   Note: This dictation was prepared with Dragon dictation along with smaller phrase technology. Any transcriptional errors that result from this process are unintentional.

## 2016-03-18 LAB — CULTURE, RESPIRATORY

## 2016-03-18 LAB — CULTURE, RESPIRATORY W GRAM STAIN

## 2016-03-19 LAB — CULTURE, BLOOD (ROUTINE X 2)
Culture: NO GROWTH
Culture: NO GROWTH

## 2016-09-05 ENCOUNTER — Encounter: Payer: Self-pay | Admitting: *Deleted

## 2016-09-13 ENCOUNTER — Ambulatory Visit: Payer: Medicare Other | Admitting: Anesthesiology

## 2016-09-13 ENCOUNTER — Encounter
Admission: RE | Disposition: A | Discharge status: Home / Self Care | Payer: Self-pay | Source: Ambulatory Visit | Attending: Ophthalmology

## 2016-09-13 ENCOUNTER — Ambulatory Visit
Admission: RE | Admit: 2016-09-13 | Discharge: 2016-09-13 | Disposition: A | Discharge status: Home / Self Care | Payer: Medicare Other | Source: Ambulatory Visit | Attending: Ophthalmology | Admitting: Ophthalmology

## 2016-09-13 DIAGNOSIS — Z01810 Encounter for preprocedural cardiovascular examination: Secondary | ICD-10-CM | POA: Insufficient documentation

## 2016-09-13 DIAGNOSIS — H2512 Age-related nuclear cataract, left eye: Secondary | ICD-10-CM | POA: Diagnosis present

## 2016-09-13 DIAGNOSIS — F329 Major depressive disorder, single episode, unspecified: Secondary | ICD-10-CM | POA: Insufficient documentation

## 2016-09-13 DIAGNOSIS — Z87891 Personal history of nicotine dependence: Secondary | ICD-10-CM | POA: Insufficient documentation

## 2016-09-13 DIAGNOSIS — Z7982 Long term (current) use of aspirin: Secondary | ICD-10-CM | POA: Insufficient documentation

## 2016-09-13 DIAGNOSIS — J449 Chronic obstructive pulmonary disease, unspecified: Secondary | ICD-10-CM | POA: Insufficient documentation

## 2016-09-13 DIAGNOSIS — E7800 Pure hypercholesterolemia, unspecified: Secondary | ICD-10-CM | POA: Insufficient documentation

## 2016-09-13 DIAGNOSIS — Z888 Allergy status to other drugs, medicaments and biological substances status: Secondary | ICD-10-CM | POA: Insufficient documentation

## 2016-09-13 DIAGNOSIS — Z79899 Other long term (current) drug therapy: Secondary | ICD-10-CM | POA: Insufficient documentation

## 2016-09-13 DIAGNOSIS — K219 Gastro-esophageal reflux disease without esophagitis: Secondary | ICD-10-CM | POA: Diagnosis not present

## 2016-09-13 DIAGNOSIS — I110 Hypertensive heart disease with heart failure: Secondary | ICD-10-CM | POA: Insufficient documentation

## 2016-09-13 DIAGNOSIS — M1990 Unspecified osteoarthritis, unspecified site: Secondary | ICD-10-CM | POA: Diagnosis not present

## 2016-09-13 DIAGNOSIS — I509 Heart failure, unspecified: Secondary | ICD-10-CM | POA: Diagnosis not present

## 2016-09-13 HISTORY — DX: Gastro-esophageal reflux disease without esophagitis: K21.9

## 2016-09-13 HISTORY — DX: Cough: R05

## 2016-09-13 HISTORY — PX: CATARACT EXTRACTION W/PHACO: SHX586

## 2016-09-13 HISTORY — DX: Unspecified hearing loss, unspecified ear: H91.90

## 2016-09-13 HISTORY — DX: Heart failure, unspecified: I50.9

## 2016-09-13 HISTORY — DX: Inflammatory liver disease, unspecified: K75.9

## 2016-09-13 HISTORY — DX: Wheezing: R06.2

## 2016-09-13 HISTORY — DX: Dysphagia, unspecified: R13.10

## 2016-09-13 HISTORY — DX: Sleep apnea, unspecified: G47.30

## 2016-09-13 HISTORY — DX: Polyneuropathy, unspecified: G62.9

## 2016-09-13 HISTORY — DX: Unspecified convulsions: R56.9

## 2016-09-13 HISTORY — DX: Restless legs syndrome: G25.81

## 2016-09-13 HISTORY — DX: Chronic obstructive pulmonary disease, unspecified: J44.9

## 2016-09-13 HISTORY — DX: Edema, unspecified: R60.9

## 2016-09-13 HISTORY — DX: Depression, unspecified: F32.A

## 2016-09-13 HISTORY — DX: Hypoxemia: R09.02

## 2016-09-13 HISTORY — DX: Major depressive disorder, single episode, unspecified: F32.9

## 2016-09-13 HISTORY — DX: Cough, unspecified: R05.9

## 2016-09-13 SURGERY — PHACOEMULSIFICATION, CATARACT, WITH IOL INSERTION
Anesthesia: Monitor Anesthesia Care | Site: Eye | Laterality: Left | Wound class: Clean

## 2016-09-13 MED ORDER — EPINEPHRINE PF 1 MG/ML IJ SOLN
INTRAMUSCULAR | Status: AC
  Filled 2016-09-13: qty 1

## 2016-09-13 MED ORDER — BSS IO SOLN
INTRAOCULAR | Status: DC | PRN
  Administered 2016-09-13: 200 mL via INTRAOCULAR

## 2016-09-13 MED ORDER — FENTANYL CITRATE (PF) 100 MCG/2ML IJ SOLN
INTRAMUSCULAR | Status: DC | PRN
  Administered 2016-09-13 (×2): 25 ug via INTRAVENOUS

## 2016-09-13 MED ORDER — NA CHONDROIT SULF-NA HYALURON 40-17 MG/ML IO SOLN
INTRAOCULAR | Status: AC
  Filled 2016-09-13: qty 1

## 2016-09-13 MED ORDER — MOXIFLOXACIN HCL 0.5 % OP SOLN
OPHTHALMIC | Status: DC | PRN
  Administered 2016-09-13: 0.2 mL via OPHTHALMIC

## 2016-09-13 MED ORDER — POVIDONE-IODINE 5 % OP SOLN
OPHTHALMIC | Status: AC
  Filled 2016-09-13: qty 30

## 2016-09-13 MED ORDER — MOXIFLOXACIN HCL 0.5 % OP SOLN: 1.0000 [drp] | OPHTHALMIC | Status: DC | PRN

## 2016-09-13 MED ORDER — MOXIFLOXACIN HCL 0.5 % OP SOLN
OPHTHALMIC | Status: AC
  Filled 2016-09-13: qty 3

## 2016-09-13 MED ORDER — SODIUM CHLORIDE 0.9 % IV SOLN
INTRAVENOUS | Status: DC
Start: 2016-09-13 — End: 2016-09-13
  Administered 2016-09-13: 08:00:00 via INTRAVENOUS

## 2016-09-13 MED ORDER — FENTANYL CITRATE (PF) 100 MCG/2ML IJ SOLN
INTRAMUSCULAR | Status: AC
  Filled 2016-09-13: qty 2

## 2016-09-13 MED ORDER — LIDOCAINE HCL (PF) 4 % IJ SOLN
INTRAMUSCULAR | Status: AC
  Filled 2016-09-13: qty 5

## 2016-09-13 MED ORDER — MIDAZOLAM HCL 2 MG/2ML IJ SOLN
INTRAMUSCULAR | Status: DC | PRN
  Administered 2016-09-13: 1 mg via INTRAVENOUS

## 2016-09-13 MED ORDER — MIDAZOLAM HCL 2 MG/2ML IJ SOLN
INTRAMUSCULAR | Status: AC
  Filled 2016-09-13: qty 2

## 2016-09-13 MED ORDER — NA CHONDROIT SULF-NA HYALURON 40-30 MG/ML IO SOLN
INTRAOCULAR | Status: DC | PRN
  Administered 2016-09-13: 1 mL via INTRAOCULAR

## 2016-09-13 MED ORDER — ARMC OPHTHALMIC DILATING DROPS
1.0000 "application " | OPHTHALMIC | Status: AC
  Administered 2016-09-13 (×3): 1 via OPHTHALMIC

## 2016-09-13 MED ORDER — LIDOCAINE HCL (PF) 4 % IJ SOLN
INTRAOCULAR | Status: DC | PRN
  Administered 2016-09-13: 4 mL via OPHTHALMIC

## 2016-09-13 MED ORDER — POVIDONE-IODINE 5 % OP SOLN
OPHTHALMIC | Status: DC | PRN
  Administered 2016-09-13: 1 via OPHTHALMIC

## 2016-09-13 MED ORDER — ARMC OPHTHALMIC DILATING DROPS
OPHTHALMIC | Status: AC
  Administered 2016-09-13: 1 via OPHTHALMIC
  Filled 2016-09-13: qty 0.4

## 2016-09-13 SURGICAL SUPPLY — 17 items
DISSECTOR HYDRO NUCLEUS 50X22 (MISCELLANEOUS) ×2 IMPLANT
GLOVE BIO SURGEON STRL SZ8 (GLOVE) ×2 IMPLANT
GLOVE BIOGEL M 6.5 STRL (GLOVE) ×2 IMPLANT
GLOVE SURG LX 7.5 STRW (GLOVE) ×1
GLOVE SURG LX STRL 7.5 STRW (GLOVE) ×1 IMPLANT
GOWN STRL REUS W/ TWL LRG LVL3 (GOWN DISPOSABLE) ×2 IMPLANT
GOWN STRL REUS W/TWL LRG LVL3 (GOWN DISPOSABLE) ×2
LABEL CATARACT MEDS ST (LABEL) ×2 IMPLANT
LENS IOL TECNIS ITEC 20.5 (Intraocular Lens) ×2 IMPLANT
PACK CATARACT (MISCELLANEOUS) ×2 IMPLANT
PACK CATARACT KING (MISCELLANEOUS) ×2 IMPLANT
PACK EYE AFTER SURG (MISCELLANEOUS) ×2 IMPLANT
SOL BSS BAG (MISCELLANEOUS) ×2
SOLUTION BSS BAG (MISCELLANEOUS) ×1 IMPLANT
STRIP CLOSURE SKIN 1/4X4 (GAUZE/BANDAGES/DRESSINGS) ×2 IMPLANT
WATER STERILE IRR 250ML POUR (IV SOLUTION) ×2 IMPLANT
WIPE NON LINTING 3.25X3.25 (MISCELLANEOUS) ×2 IMPLANT

## 2016-09-13 NOTE — Transfer of Care (Signed)
Immediate Anesthesia Transfer of Care Note  Patient: Monica Warren  Procedure(s) Performed: Procedure(s) with comments: CATARACT EXTRACTION PHACO AND INTRAOCULAR LENS PLACEMENT (IOC) (Left) - Lot #[id number] H US:01:09.7 AP%: 12.7 CDE: 8.92  Patient Location: PACU and Short Stay  Anesthesia Type:MAC  Level of Consciousness: awake  Airway & Oxygen Therapy: Patient Spontanous Breathing  Post-op Assessment: Report given to RN  Post vital signs: Reviewed  Last Vitals:  Vitals:   09/13/16 0714  BP: (!) 155/56  Pulse: 85  Resp: 16  Temp: 36.5 C  SpO2: (!) 85%    Last Pain:  Vitals:   09/13/16 0714  TempSrc: Oral         Complications: No apparent anesthesia complications

## 2016-09-13 NOTE — Anesthesia Preprocedure Evaluation (Addendum)
Anesthesia Evaluation  Patient identified by MRN, date of birth, ID band Patient awake    Reviewed: Allergy & Precautions, NPO status , Patient's Chart, lab work & pertinent test results, reviewed documented beta blocker date and time   Airway Mallampati: III  TM Distance: >3 FB     Dental  (+) Chipped   Pulmonary asthma , sleep apnea , pneumonia, resolved, COPD, former smoker,           Cardiovascular hypertension, Pt. on medications +CHF       Neuro/Psych Seizures -,  PSYCHIATRIC DISORDERS Depression    GI/Hepatic GERD  Controlled,(+) Hepatitis -  Endo/Other    Renal/GU      Musculoskeletal   Abdominal   Peds  Hematology   Anesthesia Other Findings Does use O2 at nite.RLS. Obese.  Reproductive/Obstetrics                            Anesthesia Physical Anesthesia Plan  ASA: III  Anesthesia Plan: MAC   Post-op Pain Management:    Induction:   PONV Risk Score and Plan:   Airway Management Planned:   Additional Equipment:   Intra-op Plan:   Post-operative Plan:   Informed Consent: I have reviewed the patients History and Physical, chart, labs and discussed the procedure including the risks, benefits and alternatives for the proposed anesthesia with the patient or authorized representative who has indicated his/her understanding and acceptance.     Plan Discussed with: CRNA  Anesthesia Plan Comments:         Anesthesia Quick Evaluation

## 2016-09-13 NOTE — Discharge Instructions (Signed)
Eye Surgery Discharge Instructions  Expect mild scratchy sensation or mild soreness. DO NOT RUB YOUR EYE!  The day of surgery:  Minimal physical activity, but bed rest is not required  No reading, computer work, or close hand work  No bending, lifting, or straining.  May watch TV  For 24 hours:  No driving, legal decisions, or alcoholic beverages  Safety precautions  Eat anything you prefer: It is better to start with liquids, then soup then solid foods.  _____ Eye patch should be worn until postoperative exam tomorrow.  ____ Solar shield eyeglasses should be worn for comfort in the sunlight/patch while sleeping  Resume all regular medications including aspirin or Coumadin if these were discontinued prior to surgery. You may shower, bathe, shave, or wash your hair. Tylenol may be taken for mild discomfort.  Call your doctor if you experience significant pain, nausea, or vomiting, fever > 101 or other signs of infection. [phone] or [phone] Specific instructions:  Follow-up Information    Nevada Crane, MD Follow up.   Specialty:  Ophthalmology Why:  August 24 at 9:30am Contact information: [address] [place] [address] [phone]

## 2016-09-13 NOTE — Anesthesia Postprocedure Evaluation (Signed)
Anesthesia Post Note  Patient: Monica Warren  Procedure(s) Performed: Procedure(s) (LRB): CATARACT EXTRACTION PHACO AND INTRAOCULAR LENS PLACEMENT (IOC) (Left)  Patient location during evaluation: Short Stay Anesthesia Type: MAC Level of consciousness: awake Pain management: pain level controlled Vital Signs Assessment: post-procedure vital signs reviewed and stable Respiratory status: spontaneous breathing Cardiovascular status: blood pressure returned to baseline Postop Assessment: no headache Anesthetic complications: no     Last Vitals:  Vitals:   09/13/16 0927 09/13/16 0936  BP: 126/68 126/68  Pulse: 82 82  Resp: 16   Temp:    SpO2: 98%     Last Pain:  Vitals:   09/13/16 0714  TempSrc: Oral                 [name]

## 2016-09-13 NOTE — H&P (Signed)
The History and Physical notes are on paper, have been signed, and are to be scanned.   I have examined the patient and there are no changes to the H&P.   Willey Blade 09/13/2016 8:39 AM

## 2016-09-13 NOTE — Anesthesia Procedure Notes (Signed)
Procedure Name: MAC Date/Time: 09/13/2016 8:51 AM Performed by: Hedda Slade Pre-anesthesia Checklist: Patient identified, Emergency Drugs available, Suction available and Patient being monitored Patient Re-evaluated:Patient Re-evaluated prior to induction Oxygen Delivery Method: Nasal cannula

## 2016-09-13 NOTE — Addendum Note (Signed)
Addendum  created 09/13/16 1040 by Berdine Addison, MD   Sign clinical note

## 2016-09-13 NOTE — Care Management (Signed)
No hx of A fib. EKG now shows new A Fib. Dr. Gwen Pounds consulted. Would like to see pt in office. Pt has no symptoms (SOB etc.) Will D/C here and send to his office.

## 2016-09-13 NOTE — Op Note (Signed)
OPERATIVE NOTE  Monica Warren [id number] 09/13/2016   PREOPERATIVE DIAGNOSIS:  Nuclear sclerotic cataract left eye.  H25.12   POSTOPERATIVE DIAGNOSIS:    Nuclear sclerotic cataract left eye.     PROCEDURE:  Phacoemusification with posterior chamber intraocular lens placement of the left eye   LENS:   Implant Name Type Inv. Item Serial No. Manufacturer Lot No. LRB No. Used  LENS IOL DIOP 20.5 - [id number] Intraocular Lens LENS IOL DIOP 20.5 [id number] AMO   Left 1       PCB00 +20.5   ULTRASOUND TIME: 1 minutes 09 seconds.  CDE 8.92   SURGEON:  Willey Blade, MD, MPH   ANESTHESIA:  Topical with tetracaine drops augmented with 1% preservative-free intracameral lidocaine.  ESTIMATED BLOOD LOSS: <1 mL   COMPLICATIONS:  None.   DESCRIPTION OF PROCEDURE:  The patient was identified in the holding room and transported to the operating room and placed in the supine position under the operating microscope.  The left eye was identified as the operative eye and it was prepped and draped in the usual sterile ophthalmic fashion.   A 1.0 millimeter clear-corneal paracentesis was made at the 5:00 position. 0.5 ml of preservative-free 1% lidocaine with epinephrine was injected into the anterior chamber.  The anterior chamber was filled with Discovisc viscoelastic.  A 2.4 millimeter keratome was used to make a near-clear corneal incision at the 2:00 position.  A curvilinear capsulorrhexis was made with a cystotome and capsulorrhexis forceps.  Balanced salt solution was used to hydrodissect and hydrodelineate the nucleus.   Phacoemulsification was then used in stop and chop fashion to remove the lens nucleus and epinucleus.  The remaining cortex was then removed using the irrigation and aspiration handpiece. Discovisc was then placed into the capsular bag to distend it for lens placement.  A lens was then injected into the capsular bag.  The remaining viscoelastic was aspirated.   Wounds were  hydrated with balanced salt solution.  The anterior chamber was inflated to a physiologic pressure with balanced salt solution.  Intracameral vigamox 0.1 mL undiltued was injected into the eye and a drop placed onto the ocular surface.  No wound leaks were noted.  The patient was taken to the recovery room in stable condition without complications of anesthesia or surgery  Willey Blade 09/13/2016, 9:27 AM

## 2016-09-13 NOTE — Anesthesia Post-op Follow-up Note (Signed)
Anesthesia QCDR form completed.        

## 2016-10-11 ENCOUNTER — Ambulatory Visit: Payer: Medicare Other | Admitting: Anesthesiology

## 2016-10-11 ENCOUNTER — Encounter
Admission: RE | Disposition: A | Discharge status: Home / Self Care | Payer: Self-pay | Source: Ambulatory Visit | Attending: Ophthalmology

## 2016-10-11 ENCOUNTER — Ambulatory Visit
Admission: RE | Admit: 2016-10-11 | Discharge: 2016-10-11 | Disposition: A | Discharge status: Home / Self Care | Payer: Medicare Other | Source: Ambulatory Visit | Attending: Ophthalmology | Admitting: Ophthalmology

## 2016-10-11 ENCOUNTER — Encounter: Payer: Self-pay | Admitting: Emergency Medicine

## 2016-10-11 DIAGNOSIS — J449 Chronic obstructive pulmonary disease, unspecified: Secondary | ICD-10-CM | POA: Diagnosis not present

## 2016-10-11 DIAGNOSIS — I509 Heart failure, unspecified: Secondary | ICD-10-CM | POA: Insufficient documentation

## 2016-10-11 DIAGNOSIS — Z87891 Personal history of nicotine dependence: Secondary | ICD-10-CM | POA: Insufficient documentation

## 2016-10-11 DIAGNOSIS — K219 Gastro-esophageal reflux disease without esophagitis: Secondary | ICD-10-CM | POA: Insufficient documentation

## 2016-10-11 DIAGNOSIS — H2511 Age-related nuclear cataract, right eye: Secondary | ICD-10-CM | POA: Insufficient documentation

## 2016-10-11 DIAGNOSIS — I110 Hypertensive heart disease with heart failure: Secondary | ICD-10-CM | POA: Diagnosis not present

## 2016-10-11 DIAGNOSIS — E669 Obesity, unspecified: Secondary | ICD-10-CM | POA: Insufficient documentation

## 2016-10-11 DIAGNOSIS — F329 Major depressive disorder, single episode, unspecified: Secondary | ICD-10-CM | POA: Diagnosis not present

## 2016-10-11 DIAGNOSIS — G2581 Restless legs syndrome: Secondary | ICD-10-CM | POA: Insufficient documentation

## 2016-10-11 HISTORY — PX: CATARACT EXTRACTION W/PHACO: SHX586

## 2016-10-11 SURGERY — PHACOEMULSIFICATION, CATARACT, WITH IOL INSERTION
Anesthesia: Monitor Anesthesia Care | Site: Eye | Laterality: Right | Wound class: Clean

## 2016-10-11 MED ORDER — ONDANSETRON HCL 4 MG/2ML IJ SOLN: 4.0000 mg | Freq: Once | INTRAMUSCULAR | Status: DC | PRN

## 2016-10-11 MED ORDER — NEOMYCIN-POLYMYXIN-DEXAMETH 3.5-10000-0.1 OP OINT
TOPICAL_OINTMENT | OPHTHALMIC | Status: AC
  Filled 2016-10-11: qty 3.5

## 2016-10-11 MED ORDER — BSS IO SOLN
INTRAOCULAR | Status: DC | PRN
  Administered 2016-10-11: 200 mL via INTRAOCULAR

## 2016-10-11 MED ORDER — LIDOCAINE HCL (PF) 4 % IJ SOLN
INTRAOCULAR | Status: DC | PRN
  Administered 2016-10-11: 4 mL via OPHTHALMIC

## 2016-10-11 MED ORDER — ARMC OPHTHALMIC DILATING DROPS
1.0000 "application " | OPHTHALMIC | Status: AC
  Administered 2016-10-11 (×3): 1 via OPHTHALMIC

## 2016-10-11 MED ORDER — DEXMEDETOMIDINE HCL 200 MCG/2ML IV SOLN
INTRAVENOUS | Status: DC | PRN
  Administered 2016-10-11: 4 ug via INTRAVENOUS
  Administered 2016-10-11 (×2): 8 ug via INTRAVENOUS

## 2016-10-11 MED ORDER — POVIDONE-IODINE 5 % OP SOLN
OPHTHALMIC | Status: AC
  Filled 2016-10-11: qty 30

## 2016-10-11 MED ORDER — MOXIFLOXACIN HCL 0.5 % OP SOLN
OPHTHALMIC | Status: AC
  Filled 2016-10-11: qty 3

## 2016-10-11 MED ORDER — MOXIFLOXACIN HCL 0.5 % OP SOLN: 1.0000 [drp] | OPHTHALMIC | Status: DC | PRN

## 2016-10-11 MED ORDER — FENTANYL CITRATE (PF) 100 MCG/2ML IJ SOLN: 25.0000 ug | INTRAMUSCULAR | Status: DC | PRN

## 2016-10-11 MED ORDER — SODIUM CHLORIDE 0.9 % IV SOLN
INTRAVENOUS | Status: DC
  Administered 2016-10-11: 09:00:00 via INTRAVENOUS

## 2016-10-11 MED ORDER — DEXMEDETOMIDINE HCL IN NACL 80 MCG/20ML IV SOLN
INTRAVENOUS | Status: AC
  Filled 2016-10-11: qty 20

## 2016-10-11 MED ORDER — HYALURONIDASE HUMAN 150 UNIT/ML IJ SOLN
INTRAMUSCULAR | Status: AC
  Filled 2016-10-11: qty 1

## 2016-10-11 MED ORDER — POVIDONE-IODINE 5 % OP SOLN
OPHTHALMIC | Status: DC | PRN
  Administered 2016-10-11: 1 via OPHTHALMIC

## 2016-10-11 MED ORDER — NA CHONDROIT SULF-NA HYALURON 40-17 MG/ML IO SOLN
INTRAOCULAR | Status: AC
  Filled 2016-10-11: qty 1

## 2016-10-11 MED ORDER — MOXIFLOXACIN HCL 0.5 % OP SOLN
OPHTHALMIC | Status: DC | PRN
  Administered 2016-10-11: 0.2 mL via OPHTHALMIC

## 2016-10-11 MED ORDER — MIDAZOLAM HCL 2 MG/2ML IJ SOLN
INTRAMUSCULAR | Status: DC | PRN
  Administered 2016-10-11 (×2): 1 mg via INTRAVENOUS

## 2016-10-11 MED ORDER — NEOMYCIN-POLYMYXIN-DEXAMETH 3.5-10000-0.1 OP OINT
TOPICAL_OINTMENT | OPHTHALMIC | Status: DC | PRN
  Administered 2016-10-11: 1 via OPHTHALMIC

## 2016-10-11 MED ORDER — MIDAZOLAM HCL 2 MG/2ML IJ SOLN
INTRAMUSCULAR | Status: AC
  Filled 2016-10-11: qty 2

## 2016-10-11 MED ORDER — EPINEPHRINE PF 1 MG/ML IJ SOLN
INTRAMUSCULAR | Status: AC
  Filled 2016-10-11: qty 1

## 2016-10-11 MED ORDER — LIDOCAINE HCL (PF) 4 % IJ SOLN
INTRAMUSCULAR | Status: AC
  Filled 2016-10-11: qty 5

## 2016-10-11 MED ORDER — BUPIVACAINE HCL (PF) 0.75 % IJ SOLN
INTRAMUSCULAR | Status: AC
  Filled 2016-10-11: qty 10

## 2016-10-11 MED ORDER — SODIUM HYALURONATE 10 MG/ML IO SOLN
INTRAOCULAR | Status: DC | PRN
  Administered 2016-10-11: 1 mL via INTRAOCULAR

## 2016-10-11 MED ORDER — ARMC OPHTHALMIC DILATING DROPS
OPHTHALMIC | Status: AC
  Administered 2016-10-11: 1 via OPHTHALMIC
  Filled 2016-10-11: qty 0.4

## 2016-10-11 MED ORDER — LIDOCAINE HCL (PF) 4 % IJ SOLN
INTRAMUSCULAR | Status: DC | PRN
  Administered 2016-10-11: 4 mL via OPHTHALMIC

## 2016-10-11 SURGICAL SUPPLY — 17 items
BANDAGE EYE OVAL (MISCELLANEOUS) ×6 IMPLANT
DISSECTOR HYDRO NUCLEUS 50X22 (MISCELLANEOUS) ×3 IMPLANT
GLOVE BIO SURGEON STRL SZ8 (GLOVE) ×3 IMPLANT
GLOVE BIOGEL M 6.5 STRL (GLOVE) ×3 IMPLANT
GLOVE SURG LX 7.5 STRW (GLOVE) ×2
GLOVE SURG LX STRL 7.5 STRW (GLOVE) ×1 IMPLANT
GOWN STRL REUS W/ TWL LRG LVL3 (GOWN DISPOSABLE) ×2 IMPLANT
GOWN STRL REUS W/TWL LRG LVL3 (GOWN DISPOSABLE) ×4
LABEL CATARACT MEDS ST (LABEL) ×3 IMPLANT
LENS IOL TECNIS ITEC 21.5 (Intraocular Lens) ×3 IMPLANT
PACK CATARACT (MISCELLANEOUS) ×3 IMPLANT
PACK CATARACT KING (MISCELLANEOUS) ×3 IMPLANT
PACK EYE AFTER SURG (MISCELLANEOUS) ×3 IMPLANT
SOL BSS BAG (MISCELLANEOUS) ×3
SOLUTION BSS BAG (MISCELLANEOUS) ×1 IMPLANT
WATER STERILE IRR 250ML POUR (IV SOLUTION) ×3 IMPLANT
WIPE NON LINTING 3.25X3.25 (MISCELLANEOUS) ×3 IMPLANT

## 2016-10-11 NOTE — OR Nursing (Signed)
Patient arrived without any O2. Patient states she wears O2 at night and during th day if needed at 3L.

## 2016-10-11 NOTE — Discharge Instructions (Signed)
Eye Surgery Discharge Instructions  Expect mild scratchy sensation or mild soreness. DO NOT RUB YOUR EYE!  The day of surgery:  Minimal physical activity, but bed rest is not required  No reading, computer work, or close hand work  No bending, lifting, or straining.  May watch TV  For 24 hours:  No driving, legal decisions, or alcoholic beverages  Safety precautions  Eat anything you prefer: It is better to start with liquids, then soup then solid foods.  ___x__ Eye patch should be worn until postoperative exam tomorrow.  ____ Solar shield eyeglasses should be worn for comfort in the sunlight/patch while sleeping  Resume all regular medications including aspirin or Coumadin if these were discontinued prior to surgery. You may shower, bathe, shave, or wash your hair. Tylenol may be taken for mild discomfort.  Call your doctor if you experience significant pain, nausea, or vomiting, fever > 101 or other signs of infection. [phone] or [phone] Specific instructions:  Follow-up Information    Nevada Crane, MD Follow up on 10/12/2016.   Specialty:  Ophthalmology Why:  follow up appointment in the [facility] office at 10:20 am Contact information: [address] [place] [address] [phone]

## 2016-10-11 NOTE — Op Note (Signed)
OPERATIVE NOTE  Monica Warren [id number] 10/11/2016   PREOPERATIVE DIAGNOSIS:  Nuclear sclerotic cataract right eye.  H25.11   POSTOPERATIVE DIAGNOSIS:    Nuclear sclerotic cataract right eye.     PROCEDURE:  Phacoemusification with posterior chamber intraocular lens placement of the right eye   LENS:   Implant Name Type Inv. Item Serial No. Manufacturer Lot No. LRB No. Used  LENS IOL DIOP 21.5 - [id number] Intraocular Lens LENS IOL DIOP 21.5 [id number] AMO   Right 1       PCB00 +21.5   ULTRASOUND TIME: 0 minutes 59 seconds.  CDE 6.17   SURGEON:  Willey Blade, MD, MPH  ANESTHESIOLOGIST: Anesthesiologist: Yves Dill, MD CRNA: Irving Burton, CRNA   ANESTHESIA:  Retrobulbar with 50/50 mix of 4% lidocaine with 0.75% bupivicaine and a small amount of hylenex  ESTIMATED BLOOD LOSS: less than 1 mL.   COMPLICATIONS:  None.   DESCRIPTION OF PROCEDURE:  The patient was identified in the holding room and transported to the operating room and placed in the supine position under the operating microscope.    A retrobulbar block was administered in the standard fashion.  The right eye was identified as the operative eye and it was prepped and draped in the usual sterile ophthalmic fashion.   A 1.0 millimeter clear-corneal paracentesis was made at the 10:30 position. 0.5 ml of preservative-free 1% lidocaine with epinephrine was injected into the anterior chamber.  The anterior chamber was filled with Discovisc viscoelastic.  A 2.4 millimeter keratome was used to make a near-clear corneal incision at the 8:00 position.  A curvilinear capsulorrhexis was made with a cystotome and capsulorrhexis forceps.  Balanced salt solution was used to hydrodissect and hydrodelineate the nucleus.   Phacoemulsification was then used in stop and chop fashion to remove the lens nucleus and epinucleus.  The remaining cortex was then removed using the irrigation and aspiration handpiece. Discovisc  was then placed into the capsular bag to distend it for lens placement.  A lens was then injected into the capsular bag.  The remaining viscoelastic was aspirated.   Wounds were hydrated with balanced salt solution.  The anterior chamber was inflated to a physiologic pressure with balanced salt solution.   Intracameral vigamox 0.1 mL undiluted was injected into the eye and a drop placed onto the ocular surface.  No wound leaks were noted.  The patient was taken to the recovery room in stable condition without complications of anesthesia or surgery  Willey Blade 10/11/2016, 11:54 AM

## 2016-10-11 NOTE — Anesthesia Preprocedure Evaluation (Signed)
Anesthesia Evaluation  Patient identified by MRN, date of birth, ID band Patient awake    Reviewed: Allergy & Precautions, NPO status , Patient's Chart, lab work & pertinent test results, reviewed documented beta blocker date and time   Airway Mallampati: III  TM Distance: >3 FB     Dental  (+) Chipped   Pulmonary asthma , sleep apnea , pneumonia, resolved, COPD, former smoker,           Cardiovascular hypertension, Pt. on medications +CHF       Neuro/Psych Seizures -,  PSYCHIATRIC DISORDERS Depression    GI/Hepatic GERD  Controlled,(+) Hepatitis -  Endo/Other    Renal/GU      Musculoskeletal   Abdominal   Peds  Hematology   Anesthesia Other Findings Does use O2 at nite.RLS. Obese.  Reproductive/Obstetrics                             Anesthesia Physical  Anesthesia Plan  ASA: III  Anesthesia Plan: MAC   Post-op Pain Management:    Induction:   PONV Risk Score and Plan:   Airway Management Planned:   Additional Equipment:   Intra-op Plan:   Post-operative Plan:   Informed Consent: I have reviewed the patients History and Physical, chart, labs and discussed the procedure including the risks, benefits and alternatives for the proposed anesthesia with the patient or authorized representative who has indicated his/her understanding and acceptance.     Plan Discussed with: CRNA  Anesthesia Plan Comments:         Anesthesia Quick Evaluation

## 2016-10-11 NOTE — Anesthesia Post-op Follow-up Note (Signed)
Anesthesia QCDR form completed.        

## 2016-10-11 NOTE — Transfer of Care (Signed)
Immediate Anesthesia Transfer of Care Note  Patient: Monica Warren  Procedure(s) Performed: Procedure(s) with comments: CATARACT EXTRACTION PHACO AND INTRAOCULAR LENS PLACEMENT (IOC) (Right) - Lot #[id number] H [place]: 00:59.3 AP%: 10.4 CDE: 6.17   Patient Location: Short Stay  Anesthesia Type:MAC  Level of Consciousness: awake, alert  and oriented  Airway & Oxygen Therapy: Patient connected to nasal cannula oxygen  Post-op Assessment: Post -op Vital signs reviewed and stable  Post vital signs: stable  Last Vitals:  Vitals:   10/11/16 0924 10/11/16 1158  BP:  128/70  Pulse:  78  Resp:  10  Temp:  (!) 36.3 C  SpO2: 98% 100%    Last Pain:  Vitals:   10/11/16 1158  TempSrc: Temporal         Complications: No apparent anesthesia complications

## 2016-10-11 NOTE — H&P (Signed)
The History and Physical notes are on paper, have been signed, and are to be scanned.   I have examined the patient and there are no changes to the H&P.   Willey Blade 10/11/2016 11:16 AM

## 2016-10-11 NOTE — Anesthesia Postprocedure Evaluation (Signed)
Anesthesia Post Note  Patient: Monica Warren  Procedure(s) Performed: Procedure(s) (LRB): CATARACT EXTRACTION PHACO AND INTRAOCULAR LENS PLACEMENT (IOC) (Right)  Patient location during evaluation: Short Stay Anesthesia Type: MAC Level of consciousness: awake and alert Pain management: pain level controlled Vital Signs Assessment: post-procedure vital signs reviewed and stable Respiratory status: spontaneous breathing, nonlabored ventilation, respiratory function stable and patient connected to nasal cannula oxygen Cardiovascular status: stable and blood pressure returned to baseline Postop Assessment: no apparent nausea or vomiting Anesthetic complications: no     Last Vitals:  Vitals:   10/11/16 0924 10/11/16 1158  BP:  128/70  Pulse:  78  Resp:  10  Temp:  (!) 36.3 C  SpO2: 98% 100%    Last Pain:  Vitals:   10/11/16 1158  TempSrc: Temporal                 Monica Warren,  Clearnce Sorrel

## 2016-11-16 ENCOUNTER — Ambulatory Visit: Payer: Medicare Other | Attending: Internal Medicine

## 2016-11-16 DIAGNOSIS — G4733 Obstructive sleep apnea (adult) (pediatric): Secondary | ICD-10-CM | POA: Diagnosis present

## 2016-11-16 DIAGNOSIS — I509 Heart failure, unspecified: Secondary | ICD-10-CM | POA: Insufficient documentation

## 2016-11-16 DIAGNOSIS — F5101 Primary insomnia: Secondary | ICD-10-CM | POA: Insufficient documentation

## 2017-03-22 DEATH — deceased

## 2017-07-22 IMAGING — CR DG CHEST 2V
1 series · 2 of 2 positions shown · non-contrast
Comparison: 06/24/2014 chest radiograph.

CLINICAL DATA: 83 y/o  F; shortness of breath.

EXAM:
CHEST  2 VIEW

[Series 1: dg chest 2 view · 0.14mm/px · 2 of 2 slices shown]
[im 1/2]
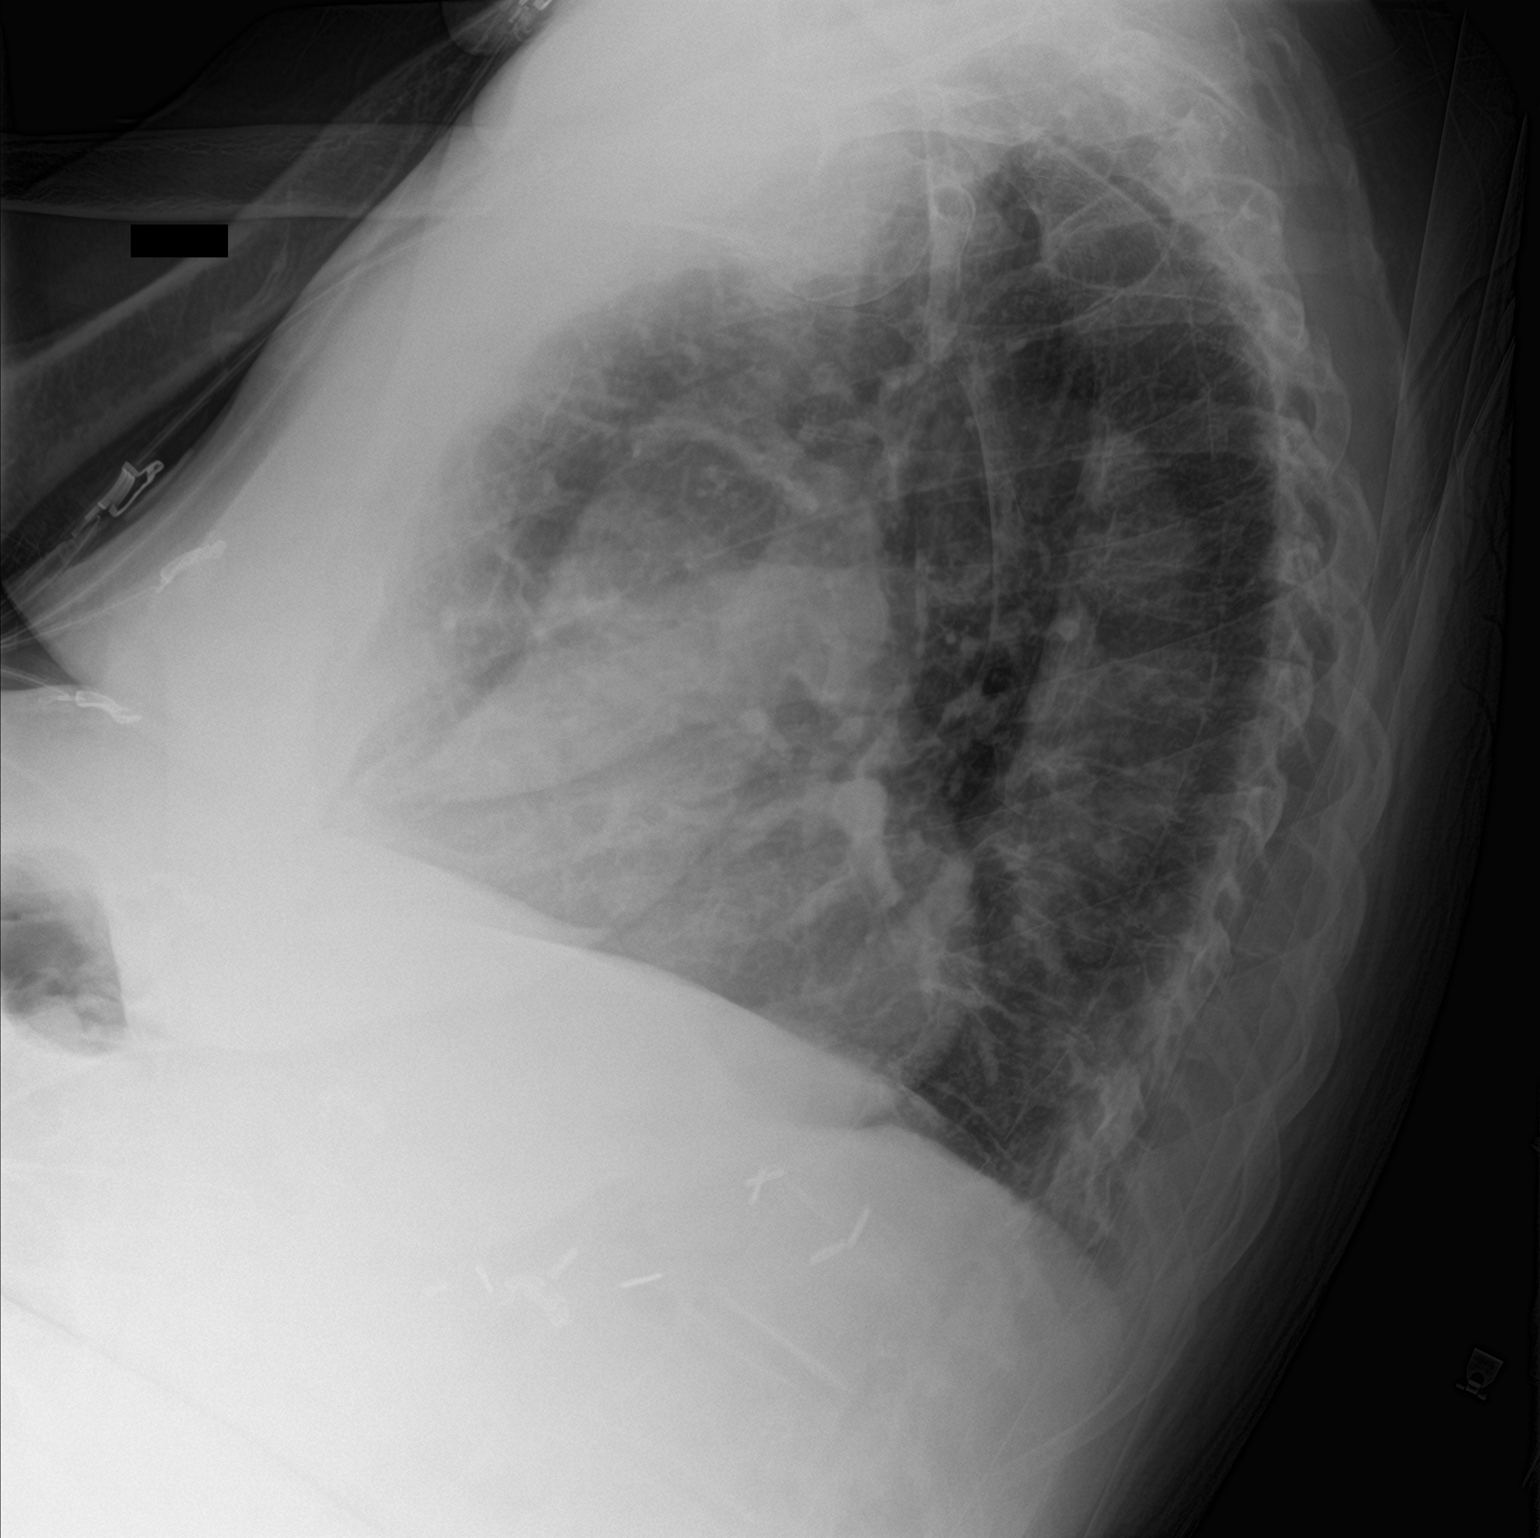
[im 2/2]
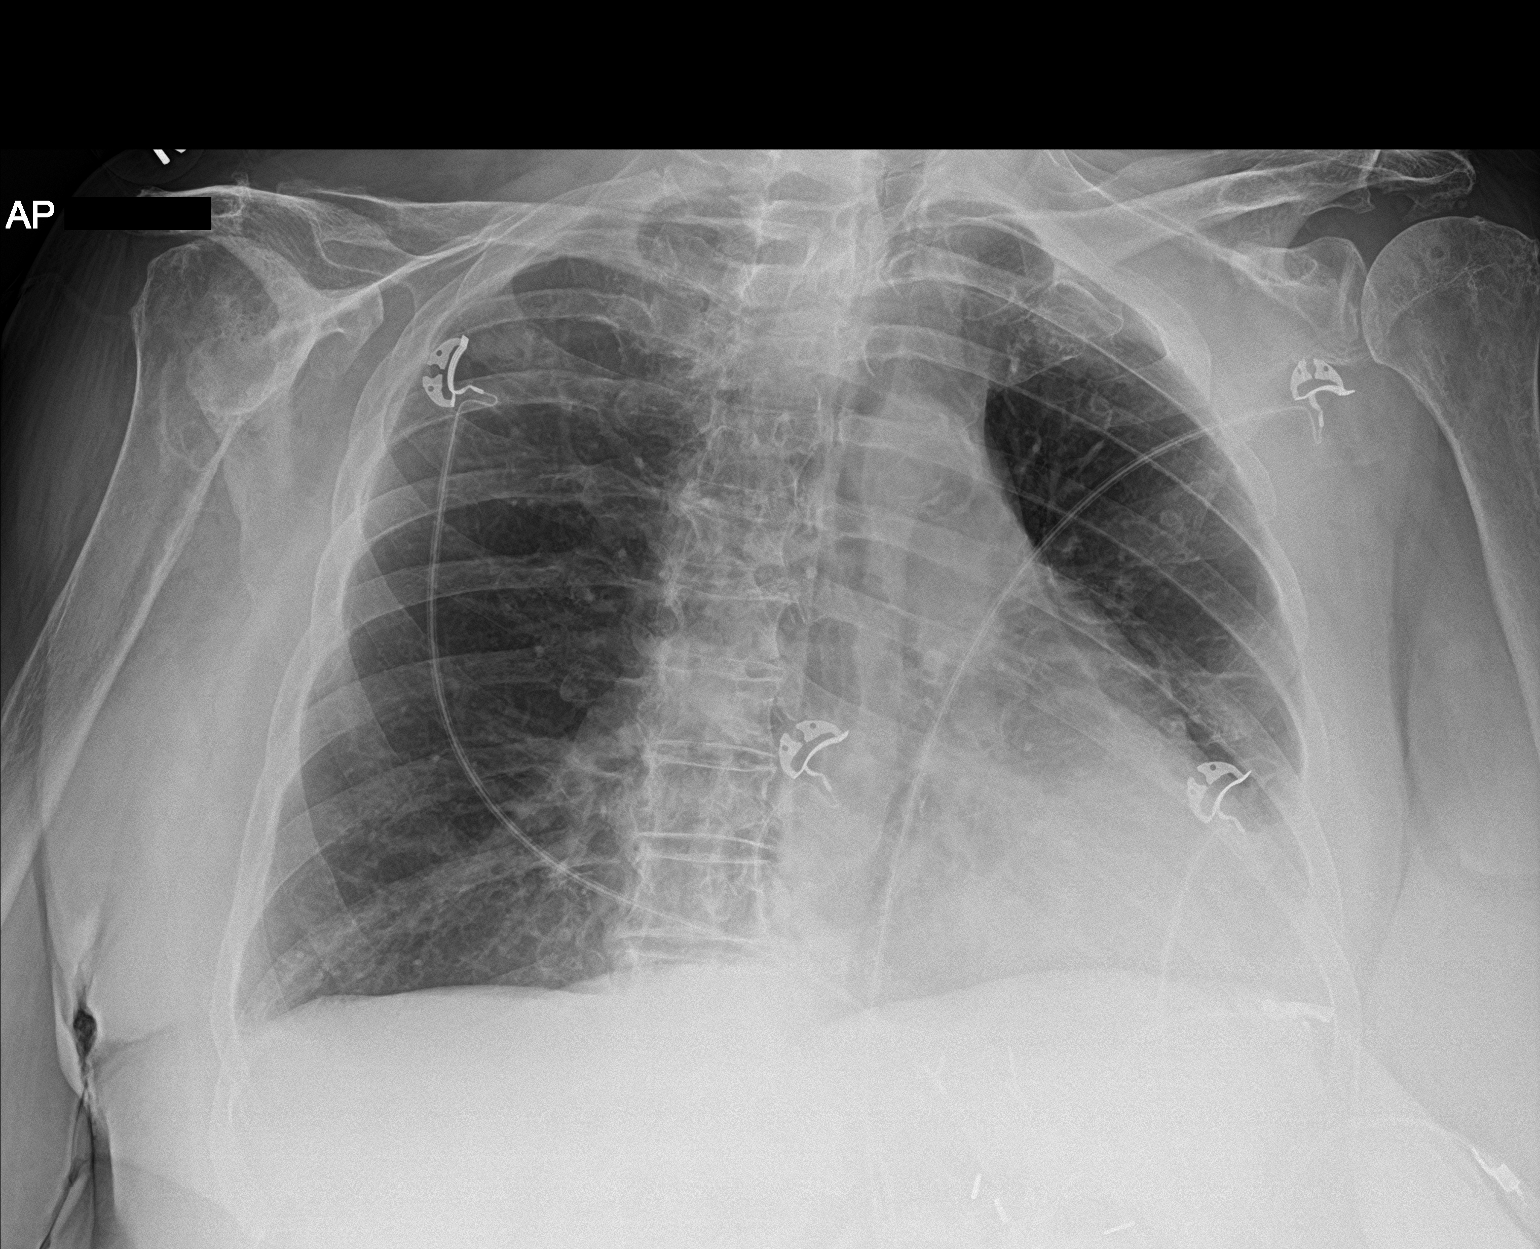

[2 of 2 positions shown; findings below may reference images not displayed]

FINDINGS: Obscuration of left heart border, probable lingular opacity. Stable
cardiac silhouette given projection and technique. Aortic
atherosclerosis with calcification. Left upper quadrant surgical
clips. Bones are demineralized. No acute osseous abnormality
identified.
IMPRESSION: Opacity within the lingula suspicious for pneumonia.

These results will be called to the ordering clinician or
representative by the Radiologist Assistant, and communication
documented in the PACS or zVision Dashboard.

By: Maleiko Mirgan M.D.
